# Patient Record
Sex: Female | Born: 1935 | Race: White | Hispanic: No | Smoking: Never smoker
Health system: Southern US, Community
[De-identification: ages and names within clinical notes are randomized; demographics above are authoritative.]

## PROBLEM LIST (undated history)

## (undated) DIAGNOSIS — H9190 Unspecified hearing loss, unspecified ear: Secondary | ICD-10-CM

## (undated) DIAGNOSIS — E785 Hyperlipidemia, unspecified: Secondary | ICD-10-CM

## (undated) DIAGNOSIS — I10 Essential (primary) hypertension: Secondary | ICD-10-CM

## (undated) DIAGNOSIS — J479 Bronchiectasis, uncomplicated: Secondary | ICD-10-CM

## (undated) DIAGNOSIS — I272 Pulmonary hypertension, unspecified: Secondary | ICD-10-CM

## (undated) DIAGNOSIS — A310 Pulmonary mycobacterial infection: Secondary | ICD-10-CM

## (undated) HISTORY — PX: BREAST BIOPSY: SHX20

---

## 2016-05-08 ENCOUNTER — Inpatient Hospital Stay (HOSPITAL_BASED_OUTPATIENT_CLINIC_OR_DEPARTMENT_OTHER)
Admission: EM | Admit: 2016-05-08 | Discharge: 2016-05-11 | DRG: 563 | Disposition: A | Discharge status: Skilled Nursing Facility | Payer: Medicare Other | Attending: Internal Medicine | Admitting: Internal Medicine

## 2016-05-08 ENCOUNTER — Emergency Department (HOSPITAL_BASED_OUTPATIENT_CLINIC_OR_DEPARTMENT_OTHER): Payer: Medicare Other

## 2016-05-08 ENCOUNTER — Encounter (HOSPITAL_BASED_OUTPATIENT_CLINIC_OR_DEPARTMENT_OTHER): Payer: Self-pay | Admitting: *Deleted

## 2016-05-08 DIAGNOSIS — D72829 Elevated white blood cell count, unspecified: Secondary | ICD-10-CM | POA: Diagnosis present

## 2016-05-08 DIAGNOSIS — E785 Hyperlipidemia, unspecified: Secondary | ICD-10-CM | POA: Diagnosis present

## 2016-05-08 DIAGNOSIS — E860 Dehydration: Secondary | ICD-10-CM | POA: Diagnosis present

## 2016-05-08 DIAGNOSIS — E876 Hypokalemia: Secondary | ICD-10-CM | POA: Diagnosis present

## 2016-05-08 DIAGNOSIS — S82402A Unspecified fracture of shaft of left fibula, initial encounter for closed fracture: Secondary | ICD-10-CM

## 2016-05-08 DIAGNOSIS — I7300 Raynaud's syndrome without gangrene: Secondary | ICD-10-CM | POA: Diagnosis present

## 2016-05-08 DIAGNOSIS — Z8249 Family history of ischemic heart disease and other diseases of the circulatory system: Secondary | ICD-10-CM

## 2016-05-08 DIAGNOSIS — Z882 Allergy status to sulfonamides status: Secondary | ICD-10-CM | POA: Diagnosis not present

## 2016-05-08 DIAGNOSIS — W1839XA Other fall on same level, initial encounter: Secondary | ICD-10-CM | POA: Diagnosis present

## 2016-05-08 DIAGNOSIS — R55 Syncope and collapse: Secondary | ICD-10-CM | POA: Diagnosis present

## 2016-05-08 DIAGNOSIS — Z833 Family history of diabetes mellitus: Secondary | ICD-10-CM | POA: Diagnosis not present

## 2016-05-08 DIAGNOSIS — I272 Other secondary pulmonary hypertension: Secondary | ICD-10-CM | POA: Diagnosis present

## 2016-05-08 DIAGNOSIS — S82892A Other fracture of left lower leg, initial encounter for closed fracture: Secondary | ICD-10-CM | POA: Diagnosis not present

## 2016-05-08 DIAGNOSIS — T502X5A Adverse effect of carbonic-anhydrase inhibitors, benzothiadiazides and other diuretics, initial encounter: Secondary | ICD-10-CM | POA: Diagnosis present

## 2016-05-08 DIAGNOSIS — S8262XA Displaced fracture of lateral malleolus of left fibula, initial encounter for closed fracture: Principal | ICD-10-CM | POA: Diagnosis present

## 2016-05-08 DIAGNOSIS — I10 Essential (primary) hypertension: Secondary | ICD-10-CM | POA: Diagnosis present

## 2016-05-08 DIAGNOSIS — M25572 Pain in left ankle and joints of left foot: Secondary | ICD-10-CM | POA: Diagnosis not present

## 2016-05-08 DIAGNOSIS — Z79899 Other long term (current) drug therapy: Secondary | ICD-10-CM | POA: Diagnosis not present

## 2016-05-08 DIAGNOSIS — Z66 Do not resuscitate: Secondary | ICD-10-CM | POA: Diagnosis present

## 2016-05-08 DIAGNOSIS — E871 Hypo-osmolality and hyponatremia: Secondary | ICD-10-CM | POA: Diagnosis present

## 2016-05-08 HISTORY — DX: Essential (primary) hypertension: I10

## 2016-05-08 HISTORY — DX: Pulmonary mycobacterial infection: A31.0

## 2016-05-08 HISTORY — DX: Bronchiectasis, uncomplicated: J47.9

## 2016-05-08 HISTORY — DX: Pulmonary hypertension, unspecified: I27.20

## 2016-05-08 HISTORY — DX: Hyperlipidemia, unspecified: E78.5

## 2016-05-08 LAB — CBC WITH DIFFERENTIAL/PLATELET
BAND NEUTROPHILS: 1 %
BASOS ABS: 0 10*3/uL (ref 0.0–0.1)
BASOS PCT: 0 %
EOS PCT: 0 %
Eosinophils Absolute: 0 10*3/uL (ref 0.0–0.7)
HEMATOCRIT: 41.5 % (ref 36.0–46.0)
Hemoglobin: 14.6 g/dL (ref 12.0–15.0)
LYMPHS ABS: 0.7 10*3/uL (ref 0.7–4.0)
LYMPHS PCT: 4 %
MCH: 30.6 pg (ref 26.0–34.0)
MCHC: 35.2 g/dL (ref 30.0–36.0)
MCV: 87 fL (ref 78.0–100.0)
MONO ABS: 1.4 10*3/uL — AB (ref 0.1–1.0)
Monocytes Relative: 8 %
NEUTROS PCT: 87 %
Neutro Abs: 16 10*3/uL — ABNORMAL HIGH (ref 1.7–7.7)
Platelets: 323 10*3/uL (ref 150–400)
RBC: 4.77 MIL/uL (ref 3.87–5.11)
RDW: 13.9 % (ref 11.5–15.5)
WBC: 18.1 10*3/uL — ABNORMAL HIGH (ref 4.0–10.5)

## 2016-05-08 LAB — TROPONIN I: Troponin I: 0.03 ng/mL (ref <0.03)

## 2016-05-08 LAB — BASIC METABOLIC PANEL
Anion gap: 13 (ref 5–15)
BUN: 25 mg/dL — ABNORMAL HIGH (ref 6–20)
CALCIUM: 9.1 mg/dL (ref 8.9–10.3)
CHLORIDE: 79 mmol/L — AB (ref 101–111)
CO2: 31 mmol/L (ref 22–32)
CREATININE: 0.76 mg/dL (ref 0.44–1.00)
GFR calc non Af Amer: >60 mL/min (ref >60)
GLUCOSE: 112 mg/dL — AB (ref 65–99)
Potassium: 2.9 mmol/L — ABNORMAL LOW (ref 3.5–5.1)
Sodium: 123 mmol/L — ABNORMAL LOW (ref 135–145)

## 2016-05-08 LAB — CORTISOL: Cortisol, Plasma: 11.7 ug/dL

## 2016-05-08 LAB — PHOSPHORUS: PHOSPHORUS: 2.5 mg/dL (ref 2.5–4.6)

## 2016-05-08 MED ORDER — SODIUM CHLORIDE 0.9 % IV SOLN: INTRAVENOUS | Status: AC

## 2016-05-08 MED ORDER — POTASSIUM CHLORIDE 10 MEQ/100ML IV SOLN
10.0000 meq | Freq: Once | INTRAVENOUS | Status: AC
  Administered 2016-05-08: 10 meq via INTRAVENOUS
  Filled 2016-05-08: qty 100

## 2016-05-08 MED ORDER — GUAIFENESIN ER 600 MG PO TB12
600.0000 mg | ORAL_TABLET | Freq: Two times a day (BID) | ORAL | Status: DC
  Administered 2016-05-09 – 2016-05-11 (×5): 600 mg via ORAL
  Filled 2016-05-08 (×5): qty 1

## 2016-05-08 MED ORDER — SODIUM CHLORIDE 0.9 % IV BOLUS (SEPSIS)
500.0000 mL | Freq: Once | INTRAVENOUS | Status: AC
  Administered 2016-05-08: 500 mL via INTRAVENOUS

## 2016-05-08 MED ORDER — ACETAMINOPHEN 650 MG RE SUPP: 650.0000 mg | Freq: Four times a day (QID) | RECTAL | Status: DC | PRN

## 2016-05-08 MED ORDER — ARFORMOTEROL TARTRATE 15 MCG/2ML IN NEBU
15.0000 ug | INHALATION_SOLUTION | Freq: Two times a day (BID) | RESPIRATORY_TRACT | Status: DC
  Administered 2016-05-09 – 2016-05-11 (×5): 15 ug via RESPIRATORY_TRACT
  Filled 2016-05-08 (×6): qty 2

## 2016-05-08 MED ORDER — ACETAMINOPHEN 325 MG PO TABS: 650.0000 mg | ORAL_TABLET | Freq: Four times a day (QID) | ORAL | Status: DC | PRN

## 2016-05-08 MED ORDER — DILTIAZEM HCL ER 180 MG PO CP24: 180.0000 mg | ORAL_CAPSULE | Freq: Every day | ORAL | Status: DC

## 2016-05-08 MED ORDER — POLYETHYLENE GLYCOL 3350 17 G PO PACK: 17.0000 g | PACK | Freq: Every day | ORAL | Status: DC | PRN

## 2016-05-08 MED ORDER — POTASSIUM CHLORIDE 10 MEQ/100ML IV SOLN
10.0000 meq | INTRAVENOUS | Status: AC
  Administered 2016-05-08 (×4): 10 meq via INTRAVENOUS
  Filled 2016-05-08 (×5): qty 100

## 2016-05-08 MED ORDER — HYDROCODONE-ACETAMINOPHEN 5-325 MG PO TABS: 1.0000 | ORAL_TABLET | ORAL | Status: DC | PRN

## 2016-05-08 MED ORDER — POTASSIUM CHLORIDE CRYS ER 20 MEQ PO TBCR
40.0000 meq | EXTENDED_RELEASE_TABLET | Freq: Once | ORAL | Status: AC
  Administered 2016-05-08: 40 meq via ORAL
  Filled 2016-05-08: qty 2

## 2016-05-08 MED ORDER — MAGNESIUM SULFATE 50 % IJ SOLN
1.0000 g | Freq: Once | INTRAMUSCULAR | Status: AC
  Administered 2016-05-08: 1 g via INTRAVENOUS
  Filled 2016-05-08: qty 2

## 2016-05-08 MED ORDER — ALBUTEROL SULFATE (2.5 MG/3ML) 0.083% IN NEBU: 2.5000 mg | INHALATION_SOLUTION | RESPIRATORY_TRACT | Status: DC | PRN

## 2016-05-08 MED ORDER — SODIUM CHLORIDE 0.9% FLUSH
3.0000 mL | Freq: Two times a day (BID) | INTRAVENOUS | Status: DC
  Administered 2016-05-10 – 2016-05-11 (×2): 3 mL via INTRAVENOUS

## 2016-05-08 MED ORDER — ONDANSETRON HCL 4 MG/2ML IJ SOLN: 4.0000 mg | Freq: Three times a day (TID) | INTRAMUSCULAR | Status: AC | PRN

## 2016-05-08 MED ORDER — SIMVASTATIN 10 MG PO TABS
10.0000 mg | ORAL_TABLET | Freq: Every day | ORAL | Status: DC
  Administered 2016-05-09 – 2016-05-10 (×2): 10 mg via ORAL
  Filled 2016-05-08 (×2): qty 1

## 2016-05-08 MED ORDER — ONDANSETRON HCL 4 MG/2ML IJ SOLN: 4.0000 mg | Freq: Four times a day (QID) | INTRAMUSCULAR | Status: DC | PRN

## 2016-05-08 MED ORDER — BISACODYL 10 MG RE SUPP: 10.0000 mg | Freq: Every day | RECTAL | Status: DC | PRN

## 2016-05-08 MED ORDER — DILTIAZEM HCL ER COATED BEADS 180 MG PO CP24
180.0000 mg | ORAL_CAPSULE | Freq: Every day | ORAL | Status: DC
  Administered 2016-05-09 – 2016-05-11 (×3): 180 mg via ORAL
  Filled 2016-05-08 (×3): qty 1

## 2016-05-08 MED ORDER — AZITHROMYCIN 500 MG PO TABS
250.0000 mg | ORAL_TABLET | Freq: Every day | ORAL | Status: DC
  Administered 2016-05-09 – 2016-05-11 (×3): 250 mg via ORAL
  Filled 2016-05-08 (×3): qty 1

## 2016-05-08 MED ORDER — ONDANSETRON HCL 4 MG PO TABS: 4.0000 mg | ORAL_TABLET | Freq: Four times a day (QID) | ORAL | Status: DC | PRN

## 2016-05-08 MED ORDER — SODIUM CHLORIDE 0.9 % IV SOLN
INTRAVENOUS | Status: AC
  Administered 2016-05-08 – 2016-05-09 (×2): via INTRAVENOUS

## 2016-05-08 NOTE — ED Notes (Signed)
Ambulance arrival time documented in error.

## 2016-05-08 NOTE — ED Triage Notes (Signed)
Pt reports that she fell this morning after leaning over to make her bed.  Reports being 'fuzzy headed'.  Reports left ankle pain after falling.  Pt able to bear weight.

## 2016-05-08 NOTE — ED Notes (Signed)
Attempted to call report, but per unit secretary RN was unavailable at the time. Left call back number for RN to call back when available.

## 2016-05-08 NOTE — Progress Notes (Signed)
  [facility]  Patient presented after near syncopal episode and fall resulting in nondisplaced fibular fracture. Patient found to have a sodium of 123 and potassium of 2.9. Patient started on IV normal saline and potassium repletion and easy him. Orthopedic surgery consulted, Dr Magnus [facility]Blackman of [facility], and will see the patient when they [facility]. Patient accepted to telemetry bed for observation status.   Shelly Flattenavid Merrell, MD [facility] 05/08/2016, 2:54 PM

## 2016-05-08 NOTE — ED Notes (Signed)
Paged the hospitalist at [facility]----will call back to [phone].

## 2016-05-08 NOTE — ED Provider Notes (Signed)
[facility]-EMERGENCY DEPT Provider Note   CSN: [id number] Arrival date & time: 05/08/16  1106     History   Chief Complaint Chief Complaint  Patient presents with  . Ankle Pain    HPI Heather Gaines is a 82 y.o. female.  HPI Patient reports she got dizzy this morning and fell on making her bed.  She will has left ankle pain but has been able to bear weight.  Denies chest pain.  Patient has past medical history of pulmonary hypertension.  She thinks she may have passed out.  She did not strike her head. Past Medical History:  Diagnosis Date  . Bronchiectasis (HCC)   . Hyperlipidemia   . Hypertension   . MAI (mycobacterium avium-intracellulare) (HCC)   . Pulmonary hypertension     Patient Active Problem List   Diagnosis Date Noted  . Syncope 05/08/2016  . Ankle fracture, left 05/08/2016  . Hypokalemia 05/08/2016  . Hyponatremia 05/08/2016  . Leukocytosis 05/08/2016    History reviewed. No pertinent surgical history.  OB History    No data available       Home Medications    Prior to Admission medications   Medication Sig Start Date End Date Taking? Authorizing Provider  albuterol (PROVENTIL HFA;VENTOLIN HFA) 108 (90 Base) MCG/ACT inhaler Inhale 2 puffs into the lungs every 4 (four) hours as needed for wheezing or shortness of breath.   Yes Historical Provider, MD  azithromycin (ZITHROMAX) 250 MG tablet Take 250 mg by mouth daily.    Yes Historical Provider, MD  diltiazem (DILACOR XR) 180 MG 24 hr capsule Take 180 mg by mouth daily.   Yes Historical Provider, MD  salmeterol (SEREVENT) 50 MCG/DOSE diskus inhaler Inhale 1 puff into the lungs 2 (two) times daily.   Yes Historical Provider, MD  simvastatin (ZOCOR) 10 MG tablet Take 10 mg by mouth daily.   Yes Historical Provider, MD  HYDROcodone-acetaminophen (NORCO/VICODIN) 5-325 MG tablet Take 1-2 tablets by mouth every 6 (six) hours as needed for moderate pain. 05/11/16   Jeralyn Bennett, MD  ondansetron (ZOFRAN) 4 MG tablet  Take 1 tablet (4 mg total) by mouth every 6 (six) hours as needed for nausea. 05/11/16   Jeralyn Bennett, MD  polyethylene glycol (MIRALAX / GLYCOLAX) packet Take 17 g by mouth daily as needed for mild constipation. 05/11/16   Jeralyn Bennett, MD  potassium chloride SA (K-DUR,KLOR-CON) 20 MEQ tablet Take 1 tablet (20 mEq total) by mouth once. 05/11/16 05/11/16  Jeralyn Bennett, MD    Family History Family History  Problem Relation Age of Onset  . Diabetes Mother   . Hypertension Mother   . Hypertension Sister   . CAD Sister   . Breast cancer Sister   . Melanoma Sister   . Cancer Brother   . Stroke Neg Hx     Social History Social History  Substance Use Topics  . Smoking status: Never Smoker  . Smokeless tobacco: Never Used  . Alcohol use Yes     Comment: occasional     Allergies   Sulfa antibiotics   Review of Systems Review of Systems All other systems reviewed and are negative  Physical Exam Updated Vital Signs BP (!) 148/56   Pulse 92   Temp 98.1 F (36.7 C) (Oral)   Resp 16   Ht 5\' 3"  (1.6 m)   Wt 130 lb (59 kg)   SpO2 96%   BMI 23.03 kg/m   Physical Exam Physical Exam  Nursing note and vitals  reviewed. Constitutional: She is oriented to person, place, and time. She appears well-developed and well-nourished. No distress.  HENT:  Head: Normocephalic and atraumatic.  Eyes: Pupils are equal, round, and reactive to light.  Neck: Normal range of motion.  Cardiovascular: Normal rate and intact distal pulses.   Pulmonary/Chest: No respiratory distress.  Abdominal: Normal appearance. She exhibits no distension.  Musculoskeletal: Patient has tenderness and swelling to the left lateral malleolus .  Good capillary refill and pulses in ankles. Neurological: She is alert and oriented to person, place, and time. No cranial nerve deficit.  Skin: Skin is warm and dry. No rash noted.  Psychiatric: She has a normal mood and affect. Her behavior is normal.    ED  Treatments / Results  Labs (all labs ordered are listed, but only abnormal results are displayed) Labs Reviewed  BASIC METABOLIC PANEL - Abnormal; Notable for the following:       Result Value   Sodium 123 (*)    Potassium 2.9 (*)    Chloride 79 (*)    Glucose, Bld 112 (*)    BUN 25 (*)    All other components within normal limits  CBC WITH DIFFERENTIAL/PLATELET - Abnormal; Notable for the following:    WBC 18.1 (*)    Neutro Abs 16.0 (*)    Monocytes Absolute 1.4 (*)    All other components within normal limits  OSMOLALITY, URINE - Abnormal; Notable for the following:    Osmolality, Ur 240 (*)    All other components within normal limits  TROPONIN I - Abnormal; Notable for the following:    Troponin I 0.03 (*)    All other components within normal limits  PHOSPHORUS - Abnormal; Notable for the following:    Phosphorus 1.9 (*)    All other components within normal limits  COMPREHENSIVE METABOLIC PANEL - Abnormal; Notable for the following:    Sodium 130 (*)    Potassium 2.9 (*)    Chloride 91 (*)    Glucose, Bld 102 (*)    Calcium 8.6 (*)    Total Protein 5.2 (*)    Albumin 2.9 (*)    All other components within normal limits  BASIC METABOLIC PANEL - Abnormal; Notable for the following:    Sodium 126 (*)    Potassium 3.3 (*)    Chloride 86 (*)    CO2 33 (*)    Calcium 8.5 (*)    GFR calc non Af Amer 59 (*)    All other components within normal limits  BASIC METABOLIC PANEL - Abnormal; Notable for the following:    Sodium 130 (*)    Potassium 3.3 (*)    Chloride 91 (*)    Glucose, Bld 110 (*)    Calcium 8.5 (*)    All other components within normal limits  BASIC METABOLIC PANEL - Abnormal; Notable for the following:    Sodium 129 (*)    Potassium 2.8 (*)    Chloride 93 (*)    Glucose, Bld 117 (*)    Calcium 8.5 (*)    All other components within normal limits  BASIC METABOLIC PANEL - Abnormal; Notable for the following:    Sodium 130 (*)    Potassium 3.3 (*)     Chloride 94 (*)    Glucose, Bld 117 (*)    Calcium 8.4 (*)    All other components within normal limits  MRSA PCR SCREENING  URINALYSIS, ROUTINE W REFLEX MICROSCOPIC (NOT AT [facility])  SODIUM, URINE, RANDOM  CREATININE, URINE, RANDOM  CORTISOL  PHOSPHORUS  MAGNESIUM  TSH  CBC  TROPONIN I  TROPONIN I  TROPONIN I  CBC    EKG  EKG Interpretation  Date/Time:  Monday May 08 2016 11:38:43 EDT Ventricular Rate:  80 PR Interval:    QRS Duration: 98 QT Interval:  408 QTC Calculation: 471 R Axis:   130 Text Interpretation:  Sinus arrhythmia Ventricular premature complex Right axis deviation Probable anteroseptal infarct, old No previous tracing Abnormal ekg Confirmed by Duward Allbritton  MD, Valia Wingard ([id number]) on 05/08/2016 11:51:23 AM       Radiology No results found.  Procedures Procedures (including critical care time)  Medications Ordered in ED Medications  ondansetron (ZOFRAN) injection 4 mg (not administered)  0.9 %  sodium chloride infusion ( Intravenous New Bag/Given 05/09/16 0432)  sodium chloride 0.9 % bolus 500 mL (0 mLs Intravenous Stopped 05/08/16 1607)  magnesium sulfate (IV Push/IM) injection 1 g (1 g Intravenous Given 05/08/16 1328)  potassium chloride 10 mEq in 100 mL IVPB (0 mEq Intravenous Stopped 05/08/16 1607)  0.9 %  sodium chloride infusion ( Intravenous Duplicate 05/08/16 1900)  potassium chloride 10 mEq in 100 mL IVPB (10 mEq Intravenous Given 05/08/16 2321)  potassium chloride SA (K-DUR,KLOR-CON) CR tablet 40 mEq (40 mEq Oral Given 05/08/16 2023)  potassium chloride SA (K-DUR,KLOR-CON) CR tablet 40 mEq (40 mEq Oral Given 05/09/16 0853)  potassium chloride SA (K-DUR,KLOR-CON) CR tablet 40 mEq (40 mEq Oral Given 05/10/16 1235)  potassium chloride 10 mEq in 100 mL IVPB (10 mEq Intravenous Given 05/10/16 0917)  potassium chloride SA (K-DUR,KLOR-CON) CR tablet 40 mEq (40 mEq Oral Given 05/11/16 0958)     Initial Impression / Assessment and Plan / ED Course  I have  reviewed the triage vital signs and the nursing notes.  Pertinent labs & imaging results that were available during my care of the patient were reviewed by me and considered in my medical decision making (see chart for details).  Clinical Course  Attempted to get the patient [facility] but they had no beds.  [facility].  Remained stable throughout her stay in the emergency department.  Final Clinical Impressions(s) / ED Diagnoses   Final diagnoses:  Fibula fracture, left, closed, initial encounter  Near syncope  Hyponatremia  Hypokalemia    New Prescriptions Discharge Medication List as of 05/11/2016 12:42 PM    START taking these medications   Details  ondansetron (ZOFRAN) 4 MG tablet Take 1 tablet (4 mg total) by mouth every 6 (six) hours as needed for nausea., Starting Thu 05/11/2016, Normal    polyethylene glycol (MIRALAX / GLYCOLAX) packet Take 17 g by mouth daily as needed for mild constipation., Starting Thu 05/11/2016, Normal    potassium chloride SA (K-DUR,KLOR-CON) 20 MEQ tablet Take 1 tablet (20 mEq total) by mouth once., Starting Thu 05/11/2016, Normal    HYDROcodone-acetaminophen (NORCO/VICODIN) 5-325 MG tablet Take 1-2 tablets by mouth every 6 (six) hours as needed for moderate pain., Starting Thu 05/11/2016, Print         Nelva Nayobert Aviva Wolfer, MD 05/14/16 [phone]

## 2016-05-08 NOTE — H&P (Addendum)
Heather CharonJune Sky [id number] DOB: 08/31/1924 DOA: 05/08/2016     PCP: Nadara EatonPIAZZA, MICHAEL J, MD   Outpatient Specialists: [facility] Dr. Su MonksEjaz , GI [facility] Patient [facility] from From [facility]  Chief Complaint: fall  HPI: Heather Ladona Ridgelaylor is a 82 y.o. female with medical history significant of Mycobacterium avium-intracellulare, Pulmonary HTN, AMD, HTN, HLD, Raynaud's phenomenom, and Bronchiectasis asthma as an infant   Presented with mechanical fall this morning after she leaned over to fix her bed. She felt lihghtheaded and felt her self falling unable to catch her self. Her left ankle got entrapped by the bed and twisted.  She's been feeling lightheaded prior to this. After falling she reported significant left ankle pain. Patient reports near syncopal episode prior to the fall no chest pain , o fever she has chronic cough she has been on oxygen in the past but not recently. In emergency department patient was found to have sodium of 123 potassium 2.9 she was given IV fluids and potassium was replaced. Patient reports she has been eating well and drinks about 9 glasses a day of water. Denies nausea vomiting or diarrhea  Regarding her bronchiectasis patient is on chronic azithromycin just finished a course of prednisone and Ceftindir Regarding pertinent Chronic problems: Patient has known history of bronchiectasis with recurrent exacerbations secondary to MAI followed by pulmonology at [facility]   IN ER:  Temp (24hrs), Avg:97.9 F (36.6 C), Min:97.9 F (36.6 C), Max:97.9 F (36.6 C)    Following Medications were ordered in ER: Medications  0.9 %  sodium chloride infusion (not administered)  ondansetron (ZOFRAN) injection 4 mg (not administered)  sodium chloride 0.9 % bolus 500 mL (0 mLs Intravenous Stopped 05/08/16 1607)  magnesium sulfate (IV Push/IM) injection 1 g (1 g Intravenous Given 05/08/16 1328)  potassium chloride 10 mEq in 100 mL  IVPB (0 mEq Intravenous Stopped 05/08/16 1607)     ER provider discussed case with:  Dr. Tracey HarriesBlackman [facility]  Hospitalist was called for admission for hypokalemia  Review of Systems:    Pertinent positives include:   dizziness, shortness of breath at rest.   dyspnea on exertion,   excess mucus, no productive cough,   Constitutional:  No weight loss, night sweats, Fevers, chills, fatigue, weight loss  HEENT:  No headaches, Difficulty swallowing,Tooth/dental problems,Sore throat,  No sneezing, itching, ear ache, nasal congestion, post nasal drip,  Cardio-vascular:  No chest pain, Orthopnea, PND, anasarca palpitations.no Bilateral lower extremity swelling  GI:  No heartburn, indigestion, abdominal pain, nausea, vomiting, diarrhea, change in bowel habits, loss of appetite, melena, blood in stool, hematemesis Resp:  noNo non-productive cough, No coughing up of blood.No change in color of mucus.No wheezing. Skin:  no rash or lesions. No jaundice GU:  no dysuria, change in color of urine, no urgency or frequency. No straining to urinate.  No flank pain.  Musculoskeletal:  No joint pain or no joint swelling. No decreased range of motion. No back pain.  Psych:  No change in mood or affect. No depression or anxiety. No memory loss.  Neuro: no localizing neurological complaints, no tingling, no weakness, no double vision, no gait abnormality, no slurred speech, no confusion  As per HPI otherwise 10 point review of systems negative.   Past Medical History: Past Medical History:  Diagnosis Date  . Pulmonary hypertension (HCC)    History reviewed. No pertinent surgical history.   Social History:  Ambulatory   independently  reports that she has never smoked. She has never used smokeless tobacco. She reports that she does not drink alcohol or use drugs.  Allergies:   Allergies  Allergen Reactions  . Sulfa Antibiotics        Family History:   Family History    Problem Relation Age of Onset  . Diabetes Mother   . Hypertension Mother   . Hypertension Sister   . CAD Sister   . Breast cancer Sister   . Melanoma Sister   . Cancer Brother   . Stroke Neg Hx      medications: Current Meds  Medication Sig  . albuterol (PROVENTIL HFA;VENTOLIN HFA) 108 (90 Base) MCG/ACT inhaler Inhale 2 puffs into the lungs every 4 (four) hours as needed for wheezing or shortness of breath.  Marland Kitchen azithromycin (ZITHROMAX) 250 MG tablet Take by mouth daily.  . chlorthalidone (HYGROTON) 25 MG tablet Take 25 mg by mouth daily.  Marland Kitchen diltiazem (DILACOR XR) 180 MG 24 hr capsule Take 180 mg by mouth daily.  . salmeterol (SEREVENT) 50 MCG/DOSE diskus inhaler Inhale 1 puff into the lungs 2 (two) times daily.  . simvastatin (ZOCOR) 10 MG tablet Take 10 mg by mouth daily.  . [DISCONTINUED] diltiazem (CARDIZEM) 30 MG tablet Take 30 mg by mouth 4 (four) times daily.  . [DISCONTINUED] simvastatin (ZOCOR) 40 MG tablet Take 10 mg by mouth daily.       Physical Exam: Patient Vitals for the past 24 hrs:  BP Temp Temp src Pulse Resp SpO2 Height Weight  05/08/16 1630 106/64 - - 83 20 95 % - -  05/08/16 1620 (!) 131/112 - - (!) 138 19 95 % - -  05/08/16 1600 144/60 - - - 17 96 % - -  05/08/16 1530 145/69 - - - 20 98 % - -  05/08/16 1500 154/58 - - 78 10 95 % - -  05/08/16 1430 144/61 - - 75 13 93 % - -  05/08/16 1400 145/64 - - 77 12 96 % - -  05/08/16 1330 138/58 - - 83 18 96 % - -  05/08/16 1300 153/62 - - 78 16 99 % - -  05/08/16 1230 151/58 - - 73 20 94 % - -  05/08/16 1200 152/58 - - 69 15 96 % - -  05/08/16 1116 - - - - - - 5\' 3"  (1.6 m) 59 kg (130 lb)  05/08/16 1115 157/60 97.9 F (36.6 C) Oral 86 18 100 % - -    1. General:  in No Acute distress 2. Psychological: Alert and  Oriented 3. Head/ENT:   Dry Mucous Membranes                          Head Non traumatic, neck supple                         Poor Dentition 4. SKIN:  decreased Skin turgor,  Skin clean Dry and  intact no rash 5. Heart: Regular rate and rhythm no Murmur, Rub or gallop 6. Lungs: no wheezes diffuse crackles   7. Abdomen: Soft,  non-tender, Non distended 8. Lower extremities: no clubbing, cyanosis, or edema 9. Neurologically Grossly intact, moving all 4 extremities equally  10. MSK: Normal range of motion left ankle in a cast   body mass index is 23.03 kg/m.  Labs on Admission:   Labs on Admission: I have personally reviewed  following labs and imaging studies  CBC:  Recent Labs Lab 05/08/16 1145  WBC 18.1*  NEUTROABS 16.0*  HGB 14.6  HCT 41.5  MCV 87.0  PLT 323   Basic Metabolic Panel:  Recent Labs Lab 05/08/16 1145  NA 123*  K 2.9*  CL 79*  CO2 31  GLUCOSE 112*  BUN 25*  CREATININE 0.76  CALCIUM 9.1   GFR: Estimated Creatinine Clearance: 39.4 mL/min (by C-G formula based on SCr of 0.76 mg/dL). Liver Function Tests: No results for input(s): AST, ALT, ALKPHOS, BILITOT, PROT, ALBUMIN in the last 168 hours. No results for input(s): LIPASE, AMYLASE in the last 168 hours. No results for input(s): AMMONIA in the last 168 hours. Coagulation Profile: No results for input(s): INR, PROTIME in the last 168 hours. Cardiac Enzymes: No results for input(s): CKTOTAL, CKMB, CKMBINDEX, TROPONINI in the last 168 hours. BNP (last 3 results) No results for input(s): PROBNP in the last 8760 hours. HbA1C: No results for input(s): HGBA1C in the last 72 hours. CBG: No results for input(s): GLUCAP in the last 168 hours. Lipid Profile: No results for input(s): CHOL, HDL, LDLCALC, TRIG, CHOLHDL, LDLDIRECT in the last 72 hours. Thyroid Function Tests: No results for input(s): TSH, T4TOTAL, FREET4, T3FREE, THYROIDAB in the last 72 hours. Anemia Panel: No results for input(s): VITAMINB12, FOLATE, FERRITIN, TIBC, IRON, RETICCTPCT in the last 72 hours. Urine analysis: No results found for: COLORURINE, APPEARANCEUR, LABSPEC, PHURINE, GLUCOSEU, HGBUR, BILIRUBINUR, KETONESUR,  PROTEINUR, UROBILINOGEN, NITRITE, LEUKOCYTESUR Sepsis Labs: @LABRCNTIP (procalcitonin:4,lacticidven:4) )No results found for this or any previous visit (from the past 240 hour(s)).    UA  ordered  No results found for: HGBA1C  Estimated Creatinine Clearance: 39.4 mL/min (by C-G formula based on SCr of 0.76 mg/dL).  BNP (last 3 results) No results for input(s): PROBNP in the last 8760 hours.   ECG REPORT  Independently reviewed Rate: 80  Rhythm: sinus rhythm with PVC ST&T Change: No acute ischemic changes   QTC 471  Filed Weights   05/08/16 1116  Weight: 59 kg (130 lb)     Cultures: No results found for: SDES, SPECREQUEST, CULT, REPTSTATUS   Radiological Exams on Admission: Dg Ankle Complete Left  Result Date: 05/08/2016 CLINICAL DATA:  Fall, ankle injury. EXAM: LEFT ANKLE COMPLETE - 3+ VIEW COMPARISON:  None. FINDINGS: There is a slightly displaced obliquely-oriented fracture of the distal left fibula. Tiny avulsion fracture fragment is seen along the inferior margin of the medial malleolus, of uncertain age. Distal tibia otherwise intact and normally aligned. Ankle mortise is symmetric. Visualized portions of the hindfoot and midfoot appear intact and normally aligned. Prominent soft tissue swelling noted over the lateral malleolus. IMPRESSION: 1. Slightly displaced, obliquely oriented, fracture of the distal left fibula. 2. Tiny avulsion fracture fragment immediately subjacent to the medial malleolus, of uncertain age, but favored to be chronic. 3. Soft tissue swelling. Electronically Signed   By: Bary Richard M.D.   On: 05/08/2016 11:41    Chart has been reviewed    Assessment/Plan  82 y.o. female with medical history significant of Mycobacterium avium-intracellulare, Pulmonary HTN, AMD, HTN, HLD, Raynaud's phenomenom, and Bronchiectasis asthma as an infant admitted for presyncope, hyponatremia, hypokalemia and left ankle fracture  Present on Admission: . pre-Syncope -  will give gentle rehydration cycle cardiac enzymes obtain echogram orthostatics if able to tolerate . Ankle fracture,Orthopedics is aware awaiting presyncope workup and electrolyte stability is age and prior to proceeding we'll defer to orthopedics if operative treatment is required .  Hypokalemia will replace and check when he's in level . Hyponatremia will discontinue chlorthalidone will check a urine sodium and electrolytes urine osmolarity obtain UA. Follow electrolytes closely obtain serial sodium . Leukocytosis patient has just finished a course of steroids for bronchiectasis Bronchiectasis this is chronic continue home medications  Other plan as per orders.  DVT prophylaxis:  SCD     Code Status:    DNR/DNI  as per patient   Family Communication:   Family not at  Bedside    Disposition Plan:    likely will need placement for rehabilitation                                     Would benefit from PT/OT eval prior to DC                       Social Work    consulted                          Consults called:[facility]  Admission status:  inpatient     Level of care    tele         I have spent a total of 56 min on this admission   Edda Orea 05/08/2016, 11:12 PM    [facility]  Pager [phone]   after 2 AM please page floor coverage PA If 7AM-7PM, please contact the day team taking care of the patient  [email or web address]  Password [email or web address]

## 2016-05-08 NOTE — ED Notes (Signed)
Called the ED doctor at [facility] for Dr. Jen MowBeaton----transferred to [phone]

## 2016-05-08 NOTE — Progress Notes (Signed)
CRITICAL VALUE ALERT  Critical value received:  Troponin .03  Date of notification:  05/08/16  Time of notification:  2041  Critical value read back:Yes.    Nurse who received alert:  S.Nolen MuMckinney  MD notified (1st page):  K.Kirby  Time of first page:  2049  MD notified (2nd page):   Time of second page:  Responding MD:    Time MD responded:  2051

## 2016-05-09 LAB — MAGNESIUM: Magnesium: 1.7 mg/dL (ref 1.7–2.4)

## 2016-05-09 LAB — URINALYSIS, ROUTINE W REFLEX MICROSCOPIC
BILIRUBIN URINE: NEGATIVE
Glucose, UA: NEGATIVE mg/dL
HGB URINE DIPSTICK: NEGATIVE
Ketones, ur: NEGATIVE mg/dL
Leukocytes, UA: NEGATIVE
NITRITE: NEGATIVE
PROTEIN: NEGATIVE mg/dL
Specific Gravity, Urine: 1.006 (ref 1.005–1.030)
pH: 7.5 (ref 5.0–8.0)

## 2016-05-09 LAB — BASIC METABOLIC PANEL
Anion gap: 7 (ref 5–15)
Anion gap: 8 (ref 5–15)
BUN: 13 mg/dL (ref 6–20)
BUN: 16 mg/dL (ref 6–20)
CHLORIDE: 86 mmol/L — AB (ref 101–111)
CHLORIDE: 91 mmol/L — AB (ref 101–111)
CO2: 31 mmol/L (ref 22–32)
CO2: 33 mmol/L — AB (ref 22–32)
CREATININE: 0.69 mg/dL (ref 0.44–1.00)
CREATININE: 0.85 mg/dL (ref 0.44–1.00)
Calcium: 8.5 mg/dL — ABNORMAL LOW (ref 8.9–10.3)
Calcium: 8.5 mg/dL — ABNORMAL LOW (ref 8.9–10.3)
GFR calc Af Amer: >60 mL/min (ref >60)
GFR calc Af Amer: >60 mL/min (ref >60)
GFR calc non Af Amer: 59 mL/min — ABNORMAL LOW (ref >60)
GFR calc non Af Amer: >60 mL/min (ref >60)
GLUCOSE: 110 mg/dL — AB (ref 65–99)
Glucose, Bld: 98 mg/dL (ref 65–99)
Potassium: 3.3 mmol/L — ABNORMAL LOW (ref 3.5–5.1)
Potassium: 3.3 mmol/L — ABNORMAL LOW (ref 3.5–5.1)
SODIUM: 126 mmol/L — AB (ref 135–145)
Sodium: 130 mmol/L — ABNORMAL LOW (ref 135–145)

## 2016-05-09 LAB — TSH: TSH: 1 u[IU]/mL (ref 0.350–4.500)

## 2016-05-09 LAB — COMPREHENSIVE METABOLIC PANEL
ALBUMIN: 2.9 g/dL — AB (ref 3.5–5.0)
ALK PHOS: 55 U/L (ref 38–126)
ALT: 21 U/L (ref 14–54)
AST: 21 U/L (ref 15–41)
Anion gap: 9 (ref 5–15)
BUN: 13 mg/dL (ref 6–20)
CALCIUM: 8.6 mg/dL — AB (ref 8.9–10.3)
CO2: 30 mmol/L (ref 22–32)
CREATININE: 0.7 mg/dL (ref 0.44–1.00)
Chloride: 91 mmol/L — ABNORMAL LOW (ref 101–111)
GFR calc non Af Amer: >60 mL/min (ref >60)
Glucose, Bld: 102 mg/dL — ABNORMAL HIGH (ref 65–99)
Potassium: 2.9 mmol/L — ABNORMAL LOW (ref 3.5–5.1)
Sodium: 130 mmol/L — ABNORMAL LOW (ref 135–145)
Total Bilirubin: 1.1 mg/dL (ref 0.3–1.2)
Total Protein: 5.2 g/dL — ABNORMAL LOW (ref 6.5–8.1)

## 2016-05-09 LAB — PHOSPHORUS: Phosphorus: 1.9 mg/dL — ABNORMAL LOW (ref 2.5–4.6)

## 2016-05-09 LAB — CREATININE, URINE, RANDOM: Creatinine, Urine: 21.98 mg/dL

## 2016-05-09 LAB — CBC
HCT: 39.7 % (ref 36.0–46.0)
Hemoglobin: 13.4 g/dL (ref 12.0–15.0)
MCH: 29.9 pg (ref 26.0–34.0)
MCHC: 33.8 g/dL (ref 30.0–36.0)
MCV: 88.6 fL (ref 78.0–100.0)
PLATELETS: 299 10*3/uL (ref 150–400)
RBC: 4.48 MIL/uL (ref 3.87–5.11)
RDW: 13.8 % (ref 11.5–15.5)
WBC: 10.4 10*3/uL (ref 4.0–10.5)

## 2016-05-09 LAB — TROPONIN I
Troponin I: <0.03 ng/mL (ref <0.03)
Troponin I: <0.03 ng/mL (ref <0.03)

## 2016-05-09 LAB — MRSA PCR SCREENING: MRSA by PCR: NEGATIVE

## 2016-05-09 LAB — OSMOLALITY, URINE: Osmolality, Ur: 240 mOsm/kg — ABNORMAL LOW (ref 300–900)

## 2016-05-09 LAB — SODIUM, URINE, RANDOM: SODIUM UR: 63 mmol/L

## 2016-05-09 MED ORDER — ENOXAPARIN SODIUM 40 MG/0.4ML ~~LOC~~ SOLN
40.0000 mg | SUBCUTANEOUS | Status: DC
  Administered 2016-05-09 – 2016-05-10 (×2): 40 mg via SUBCUTANEOUS
  Filled 2016-05-09 (×2): qty 0.4

## 2016-05-09 MED ORDER — SODIUM CHLORIDE 0.9 % IV SOLN
INTRAVENOUS | Status: DC
  Administered 2016-05-09: 1000 mL via INTRAVENOUS
  Administered 2016-05-10: 15:00:00 via INTRAVENOUS

## 2016-05-09 MED ORDER — POTASSIUM CHLORIDE CRYS ER 20 MEQ PO TBCR
40.0000 meq | EXTENDED_RELEASE_TABLET | Freq: Once | ORAL | Status: AC
  Administered 2016-05-09: 40 meq via ORAL
  Filled 2016-05-09: qty 2

## 2016-05-09 NOTE — Consult Note (Signed)
Reason for Consult:  Left ankle fracture Referring Physician: Audie Pinto, ED MD  Heather Gaines is an 82 y.o. female.  HPI:   82 yo patient who sustained a mechanical fall yesterday when she felt dizzy while up making her bed.  With left ankle pain, was taken to [place] and found to have a left ankle fracture.  She was placed appropriately in a splint, but admitted to [facility] due to low sodium and potassium.  She does reports left ankle pain.  She is a resident of [facility] in [place].  Past Medical History:  Diagnosis Date  . Bronchiectasis ([place])   . Hyperlipidemia   . Hypertension   . MAI (mycobacterium avium-intracellulare) ([place])   . Pulmonary hypertension ([place])     History reviewed. No pertinent surgical history.  Family History  Problem Relation Age of Onset  . Diabetes Mother   . Hypertension Mother   . Hypertension Sister   . CAD Sister   . Breast cancer Sister   . Melanoma Sister   . Cancer Brother   . Stroke Neg Hx     Social History:  reports that she has never smoked. She has never used smokeless tobacco. She reports that she drinks alcohol. She reports that she does not use drugs.  Allergies:  Allergies  Allergen Reactions  . Sulfa Antibiotics     Medications: I have reviewed the patient's current medications.  Results for orders placed or performed during the hospital encounter of 05/08/16 (from the past 48 hour(s))  Basic metabolic panel     Status: Abnormal   Collection Time: 05/08/16 11:45 AM  Result Value Ref Range   Sodium 123 (L) 135 - 145 mmol/L   Potassium 2.9 (L) 3.5 - 5.1 mmol/L   Chloride 79 (L) 101 - 111 mmol/L   CO2 31 22 - 32 mmol/L   Glucose, Bld 112 (H) 65 - 99 mg/dL   BUN 25 (H) 6 - 20 mg/dL   Creatinine, Ser 0.76 0.44 - 1.00 mg/dL   Calcium 9.1 8.9 - 10.3 mg/dL   GFR calc non Af Amer >60 >60 mL/min   GFR calc Af Amer >60 >60 mL/min    Comment: (NOTE) The eGFR has been calculated using the CKD EPI equation. This calculation has not  been validated in all clinical situations. eGFR's persistently <60 mL/min signify possible Chronic Kidney Disease.    Anion gap 13 5 - 15  CBC with Differential/Platelet     Status: Abnormal   Collection Time: 05/08/16 11:45 AM  Result Value Ref Range   WBC 18.1 (H) 4.0 - 10.5 K/uL   RBC 4.77 3.87 - 5.11 MIL/uL   Hemoglobin 14.6 12.0 - 15.0 g/dL   HCT 41.5 36.0 - 46.0 %   MCV 87.0 78.0 - 100.0 fL   MCH 30.6 26.0 - 34.0 pg   MCHC 35.2 30.0 - 36.0 g/dL   RDW 13.9 11.5 - 15.5 %   Platelets 323 150 - 400 K/uL   Neutrophils Relative % 87 %   Lymphocytes Relative 4 %   Monocytes Relative 8 %   Eosinophils Relative 0 %   Basophils Relative 0 %   Band Neutrophils 1 %   Neutro Abs 16.0 (H) 1.7 - 7.7 K/uL   Lymphs Abs 0.7 0.7 - 4.0 K/uL   Monocytes Absolute 1.4 (H) 0.1 - 1.0 K/uL   Eosinophils Absolute 0.0 0.0 - 0.7 K/uL   Basophils Absolute 0.0 0.0 - 0.1 K/uL   Smear  Review LARGE PLATELETS PRESENT   Troponin I (q 6hr x 3)     Status: Abnormal   Collection Time: 05/08/16  7:34 PM  Result Value Ref Range   Troponin I 0.03 (HH) <0.03 ng/mL    Comment: CRITICAL RESULT CALLED TO, READ BACK BY AND VERIFIED WITH: C MCKINNEY,RN 2042 05/08/2016 WBOND   Cortisol     Status: None   Collection Time: 05/08/16  7:34 PM  Result Value Ref Range   Cortisol, Plasma 11.7 ug/dL    Comment: (NOTE) AM    6.7 - 22.6 ug/dL PM   <10.0       ug/dL   Phosphorus     Status: None   Collection Time: 05/08/16  7:34 PM  Result Value Ref Range   Phosphorus 2.5 2.5 - 4.6 mg/dL  Troponin I     Status: None   Collection Time: 05/08/16 11:35 PM  Result Value Ref Range   Troponin I <0.03 <0.03 ng/mL  Basic metabolic panel     Status: Abnormal   Collection Time: 05/08/16 11:35 PM  Result Value Ref Range   Sodium 126 (L) 135 - 145 mmol/L   Potassium 3.3 (L) 3.5 - 5.1 mmol/L   Chloride 86 (L) 101 - 111 mmol/L   CO2 33 (H) 22 - 32 mmol/L   Glucose, Bld 98 65 - 99 mg/dL   BUN 16 6 - 20 mg/dL   Creatinine,  Ser 0.85 0.44 - 1.00 mg/dL   Calcium 8.5 (L) 8.9 - 10.3 mg/dL   GFR calc non Af Amer 59 (L) >60 mL/min   GFR calc Af Amer >60 >60 mL/min    Comment: (NOTE) The eGFR has been calculated using the CKD EPI equation. This calculation has not been validated in all clinical situations. eGFR's persistently <60 mL/min signify possible Chronic Kidney Disease.    Anion gap 7 5 - 15  Urinalysis, Routine w reflex microscopic (not at [facility])     Status: None   Collection Time: 05/09/16 12:30 AM  Result Value Ref Range   Color, Urine YELLOW YELLOW   APPearance CLEAR CLEAR   Specific Gravity, Urine 1.006 1.005 - 1.030   pH 7.5 5.0 - 8.0   Glucose, UA NEGATIVE NEGATIVE mg/dL   Hgb urine dipstick NEGATIVE NEGATIVE   Bilirubin Urine NEGATIVE NEGATIVE   Ketones, ur NEGATIVE NEGATIVE mg/dL   Protein, ur NEGATIVE NEGATIVE mg/dL   Nitrite NEGATIVE NEGATIVE   Leukocytes, UA NEGATIVE NEGATIVE    Comment: MICROSCOPIC NOT DONE ON URINES WITH NEGATIVE PROTEIN, BLOOD, LEUKOCYTES, NITRITE, OR GLUCOSE <1000 mg/dL.  Sodium, urine, random     Status: None   Collection Time: 05/09/16 12:30 AM  Result Value Ref Range   Sodium, Ur 63 mmol/L  Creatinine, urine, random     Status: None   Collection Time: 05/09/16 12:30 AM  Result Value Ref Range   Creatinine, Urine 21.98 mg/dL  Osmolality, urine     Status: Abnormal   Collection Time: 05/09/16 12:31 AM  Result Value Ref Range   Osmolality, Ur 240 (L) 300 - 900 mOsm/kg  Magnesium     Status: None   Collection Time: 05/09/16  5:21 AM  Result Value Ref Range   Magnesium 1.7 1.7 - 2.4 mg/dL  Phosphorus     Status: Abnormal   Collection Time: 05/09/16  5:21 AM  Result Value Ref Range   Phosphorus 1.9 (L) 2.5 - 4.6 mg/dL  TSH     Status: None   Collection  Time: 05/09/16  5:21 AM  Result Value Ref Range   TSH 1.000 0.350 - 4.500 uIU/mL  Comprehensive metabolic panel     Status: Abnormal   Collection Time: 05/09/16  5:21 AM  Result Value Ref Range   Sodium 130  (L) 135 - 145 mmol/L   Potassium 2.9 (L) 3.5 - 5.1 mmol/L   Chloride 91 (L) 101 - 111 mmol/L   CO2 30 22 - 32 mmol/L   Glucose, Bld 102 (H) 65 - 99 mg/dL   BUN 13 6 - 20 mg/dL   Creatinine, Ser 0.70 0.44 - 1.00 mg/dL   Calcium 8.6 (L) 8.9 - 10.3 mg/dL   Total Protein 5.2 (L) 6.5 - 8.1 g/dL   Albumin 2.9 (L) 3.5 - 5.0 g/dL   AST 21 15 - 41 U/L   ALT 21 14 - 54 U/L   Alkaline Phosphatase 55 38 - 126 U/L   Total Bilirubin 1.1 0.3 - 1.2 mg/dL   GFR calc non Af Amer >60 >60 mL/min   GFR calc Af Amer >60 >60 mL/min    Comment: (NOTE) The eGFR has been calculated using the CKD EPI equation. This calculation has not been validated in all clinical situations. eGFR's persistently <60 mL/min signify possible Chronic Kidney Disease.    Anion gap 9 5 - 15  CBC     Status: None   Collection Time: 05/09/16  5:21 AM  Result Value Ref Range   WBC 10.4 4.0 - 10.5 K/uL   RBC 4.48 3.87 - 5.11 MIL/uL   Hemoglobin 13.4 12.0 - 15.0 g/dL   HCT 39.7 36.0 - 46.0 %   MCV 88.6 78.0 - 100.0 fL   MCH 29.9 26.0 - 34.0 pg   MCHC 33.8 30.0 - 36.0 g/dL   RDW 13.8 11.5 - 15.5 %   Platelets 299 150 - 400 K/uL  Troponin I     Status: None   Collection Time: 05/09/16  5:21 AM  Result Value Ref Range   Troponin I <0.03 <0.03 ng/mL    Dg Ankle Complete Left  Result Date: 05/08/2016 CLINICAL DATA:  Fall, ankle injury. EXAM: LEFT ANKLE COMPLETE - 3+ VIEW COMPARISON:  None. FINDINGS: There is a slightly displaced obliquely-oriented fracture of the distal left fibula. Tiny avulsion fracture fragment is seen along the inferior margin of the medial malleolus, of uncertain age. Distal tibia otherwise intact and normally aligned. Ankle mortise is symmetric. Visualized portions of the hindfoot and midfoot appear intact and normally aligned. Prominent soft tissue swelling noted over the lateral malleolus. IMPRESSION: 1. Slightly displaced, obliquely oriented, fracture of the distal left fibula. 2. Tiny avulsion fracture  fragment immediately subjacent to the medial malleolus, of uncertain age, but favored to be chronic. 3. Soft tissue swelling. Electronically Signed   By: Franki Cabot M.D.   On: 05/08/2016 11:41    ROS Blood pressure (!) 144/62, pulse 81, temperature 98.2 F (36.8 C), temperature source Oral, resp. rate 17, height '5\' 3"'  (1.6 m), weight 59 kg (130 lb), SpO2 95 %. Physical Exam  Constitutional: She is oriented to person, place, and time. She appears well-developed and well-nourished.  HENT:  Head: Normocephalic and atraumatic.  Eyes: EOM are normal. Pupils are equal, round, and reactive to light.  Neck: Normal range of motion. Neck supple.  Cardiovascular: Normal rate.   Respiratory: Effort normal and breath sounds normal.  GI: Soft. Bowel sounds are normal.  Musculoskeletal:       Left ankle: She exhibits  swelling and ecchymosis. Tenderness. Lateral malleolus tenderness found.  Neurological: She is alert and oriented to person, place, and time.  Skin: Skin is warm and dry.  Psychiatric: She has a normal mood and affect.    Assessment/Plan: Left ankle lateral malleolus fracture 1)  This fracture can likely be treated non-operatively in a splint.  She will need to remain non-weight bearing on her left ankle for likely the next 4-6 weeks.  She needs to remain in the splint for now.  Will order PT/OT.  Will need follow-up with me in one week as an outpatient.  Mcarthur Rossetti 05/09/2016, 7:28 AM

## 2016-05-09 NOTE — Care Management Note (Addendum)
Case Management Note  Patient Details  Name: Teka Deyo MRN: [id number] Date of Birth: 12/04/1924  Subjective/Objective:                 Spoke with patient and son in the room. Patient is from [facility]. Patient states that she has private duty caregiver who assists her a few hours 1-2 days a week. She states she has a cane she rarely uses and a rolator. Patient sustained a fracture to her ankle and will be non weight bearing for several weeks. Anticipate HH and DME needs. Patient and son would like to return to [facility] if possible. NO surgery planned at this time, replacing electrolytes, K+ low on admission.    Action/Plan:  CM will continue to [facility] and DME needs. [facility] will need to [facility] faxed to [facility] at [phone] Attn: Debra at discharge. They will set up PT for patient.  Addendum 05-11-16 Patient will [facility] prior to returning to [facility].   Expected Discharge Date:                  Expected Discharge Plan:  Home w Home Health Services  In-House Referral:     Discharge planning Services  CM Consult  Post Acute Care Choice:    Choice offered to:     DME Arranged:    DME Agency:     HH Arranged:    HH Agency:     Status of Service:  In process, will continue to follow  If discussed at Long Length of Stay Meetings, dates discussed:    Additional Comments:  Lawerance SabalDebbie Lagina Reader, RN 05/09/2016, 12:03 PM

## 2016-05-09 NOTE — Progress Notes (Signed)
PROGRESS NOTE    Heather Gaines  WUJ:[id number] DOB: Oct 28, 1924 DOA: 05/08/2016 PCP: Nadara Eaton, MD  Brief Narrative:  82 yo patient with PMH of Chronic bronchiectasis/MAI, Pulm HTN, HTN, who sustained a mechanical fall yesterday when she felt dizzy while up making her bed.  With left ankle pain, was taken to [facility] and found to have a left ankle fracture and hyponatremia/hypokalemia.  She was placed in a splint, but admitted to [facility]  Ortho consulted, splint and non weight bearing recommended  Assessment & Plan: 1. Dizziness -no syncope, could be due to dehydration from chlorthalidone, was hyponatremic on admission, now improving with IVF -stopped diuretic -Pt eval, may need SNF due to ankle fracture -ECHO ordered by admitting MD, will FU  2. Ankle fracture -Orthopedic consulted, Dr.Blackman recommended splint and Non weight bearing for 4-6weeks -Pt consulted  3. Hyponatremia -due to dehydration and chlorthalidone -stopped diuretic, may need alternate BP med, if BP trends up off chlorthalidone -cut down IVF  4. Hypokalemia  -due to diuretic, improving, replaced  5. Chronic bronchiectasis -with MAI, on chronic suppressive azithromycin, followed by Pulm at [facility] -this is stable, no active symptoms at this time  DVT prophylaxis:  lovenox  Code Status:    DNR/DNI  as per patient   Family Communication:   Family not at  Bedside Disposition Plan: may need SNF, await Pt/Ot input   Consultants:  Ortho  Subjective: Feels ok, some L ankle discomfort, but otherwise feels well, her Resp status is stable  Objective: Vitals:   05/08/16 1910 05/08/16 2120 05/09/16 0426 05/09/16 0734  BP: (!) 158/54 (!) 140/46 (!) 144/62   Pulse: 79 84 81   Resp: 17 16 17    Temp: 98.2 F (36.8 C) 97.5 F (36.4 C) 98.2 F (36.8 C)   TempSrc: Oral Oral Oral   SpO2: 95% 100% 95% 98%  Weight:      Height:        Intake/Output Summary (Last 24 hours) at 05/09/16 1133 Last data  filed at 05/09/16 1045  Gross per 24 hour  Intake             2440 ml  Output             3050 ml  Net             -610 ml   Filed Weights   05/08/16 1116  Weight: 59 kg (130 lb)    Examination:  General exam: Appears calm and comfortable, AAOx3, no distress Respiratory system: Clear to auscultation. Respiratory effort normal. Cardiovascular system: S1 & S2 heard, RRR. No JVD, murmurs, rubs, gallops or clicks. No pedal edema. Gastrointestinal system: Abdomen is nondistended, soft and nontender.  Central nervous system: Alert and oriented. No focal neurological deficits. Extremities: L ankle in cast Skin: No rashes, lesions or ulcers Psychiatry: Judgement and insight appear normal. Mood & affect appropriate.     Data Reviewed: I have personally reviewed following labs and imaging studies  CBC:  Recent Labs Lab 05/08/16 1145 05/09/16 0521  WBC 18.1* 10.4  NEUTROABS 16.0*  --   HGB 14.6 13.4  HCT 41.5 39.7  MCV 87.0 88.6  PLT 323 299   Basic Metabolic Panel:  Recent Labs Lab 05/08/16 1145 05/08/16 1934 05/08/16 2335 05/09/16 0521 05/09/16 0610  NA 123*  --  126* 130* 130*  K 2.9*  --  3.3* 2.9* 3.3*  CL 79*  --  86* 91* 91*  CO2 31  --  33* 30 31  GLUCOSE 112*  --  98 102* 110*  BUN 25*  --  16 13 13   CREATININE 0.76  --  0.85 0.70 0.69  CALCIUM 9.1  --  8.5* 8.6* 8.5*  MG  --   --   --  1.7  --   PHOS  --  2.5  --  1.9*  --    GFR: Estimated Creatinine Clearance: 39.4 mL/min (by C-G formula based on SCr of 0.69 mg/dL). Liver Function Tests:  Recent Labs Lab 05/09/16 0521  AST 21  ALT 21  ALKPHOS 55  BILITOT 1.1  PROT 5.2*  ALBUMIN 2.9*   No results for input(s): LIPASE, AMYLASE in the last 168 hours. No results for input(s): AMMONIA in the last 168 hours. Coagulation Profile: No results for input(s): INR, PROTIME in the last 168 hours. Cardiac Enzymes:  Recent Labs Lab 05/08/16 1934 05/08/16 2335 05/09/16 0521  TROPONINI 0.03* <0.03  <0.03   BNP (last 3 results) No results for input(s): PROBNP in the last 8760 hours. HbA1C: No results for input(s): HGBA1C in the last 72 hours. CBG: No results for input(s): GLUCAP in the last 168 hours. Lipid Profile: No results for input(s): CHOL, HDL, LDLCALC, TRIG, CHOLHDL, LDLDIRECT in the last 72 hours. Thyroid Function Tests:  Recent Labs  05/09/16 0521  TSH 1.000   Anemia Panel: No results for input(s): VITAMINB12, FOLATE, FERRITIN, TIBC, IRON, RETICCTPCT in the last 72 hours. Urine analysis:    Component Value Date/Time   COLORURINE YELLOW 05/09/2016 0030   APPEARANCEUR CLEAR 05/09/2016 0030   LABSPEC 1.006 05/09/2016 0030   PHURINE 7.5 05/09/2016 0030   GLUCOSEU NEGATIVE 05/09/2016 0030   HGBUR NEGATIVE 05/09/2016 0030   BILIRUBINUR NEGATIVE 05/09/2016 0030   KETONESUR NEGATIVE 05/09/2016 0030   PROTEINUR NEGATIVE 05/09/2016 0030   NITRITE NEGATIVE 05/09/2016 0030   LEUKOCYTESUR NEGATIVE 05/09/2016 0030   Sepsis Labs: @LABRCNTIP (procalcitonin:4,lacticidven:4)  )No results found for this or any previous visit (from the past 240 hour(s)).       Radiology Studies: Dg Ankle Complete Left  Result Date: 05/08/2016 CLINICAL DATA:  Fall, ankle injury. EXAM: LEFT ANKLE COMPLETE - 3+ VIEW COMPARISON:  None. FINDINGS: There is a slightly displaced obliquely-oriented fracture of the distal left fibula. Tiny avulsion fracture fragment is seen along the inferior margin of the medial malleolus, of uncertain age. Distal tibia otherwise intact and normally aligned. Ankle mortise is symmetric. Visualized portions of the hindfoot and midfoot appear intact and normally aligned. Prominent soft tissue swelling noted over the lateral malleolus. IMPRESSION: 1. Slightly displaced, obliquely oriented, fracture of the distal left fibula. 2. Tiny avulsion fracture fragment immediately subjacent to the medial malleolus, of uncertain age, but favored to be chronic. 3. Soft tissue  swelling. Electronically Signed   By: Bary RichardStan  Maynard M.D.   On: 05/08/2016 11:41        Scheduled Meds: . arformoterol  15 mcg Nebulization BID  . azithromycin  250 mg Oral Daily  . diltiazem  180 mg Oral Daily  . guaiFENesin  600 mg Oral BID  . simvastatin  10 mg Oral q1800  . sodium chloride flush  3 mL Intravenous Q12H   Continuous Infusions: . sodium chloride 500 mL (05/09/16 0853)     LOS: 1 day    Time spent: 35min    Zannie CovePreetha Maija Biggers, MD [facility] Pager [phone]  If 7PM-7AM, please contact night-coverage [email or web address] Password [email or web address] 05/09/2016, 11:33 AM

## 2016-05-09 NOTE — Discharge Instructions (Signed)
Keep your left ankle splint clean and dry. Elevation as needed for swelling.  Absolutely no weight at all on your left ankle.

## 2016-05-10 LAB — BASIC METABOLIC PANEL WITH GFR
Anion gap: 7 (ref 5–15)
BUN: 11 mg/dL (ref 6–20)
CO2: 29 mmol/L (ref 22–32)
Calcium: 8.5 mg/dL — ABNORMAL LOW (ref 8.9–10.3)
Chloride: 93 mmol/L — ABNORMAL LOW (ref 101–111)
Creatinine, Ser: 0.63 mg/dL (ref 0.44–1.00)
GFR calc Af Amer: >60 mL/min
GFR calc non Af Amer: >60 mL/min
Glucose, Bld: 117 mg/dL — ABNORMAL HIGH (ref 65–99)
Potassium: 2.8 mmol/L — ABNORMAL LOW (ref 3.5–5.1)
Sodium: 129 mmol/L — ABNORMAL LOW (ref 135–145)

## 2016-05-10 MED ORDER — POTASSIUM CHLORIDE 10 MEQ/100ML IV SOLN: 10.0000 meq | INTRAVENOUS | Status: DC

## 2016-05-10 MED ORDER — POTASSIUM CHLORIDE CRYS ER 20 MEQ PO TBCR
40.0000 meq | EXTENDED_RELEASE_TABLET | Freq: Four times a day (QID) | ORAL | Status: AC
  Administered 2016-05-10 (×2): 40 meq via ORAL
  Filled 2016-05-10 (×2): qty 2

## 2016-05-10 MED ORDER — POTASSIUM CHLORIDE 10 MEQ/100ML IV SOLN
10.0000 meq | INTRAVENOUS | Status: AC
  Administered 2016-05-10 (×2): 10 meq via INTRAVENOUS
  Filled 2016-05-10 (×2): qty 100

## 2016-05-10 NOTE — Progress Notes (Addendum)
Documented to wrong patient

## 2016-05-10 NOTE — Progress Notes (Addendum)
PROGRESS NOTE    Heather Gaines  ZOX:[id number] DOB: September 14, 1924 DOA: 05/08/2016 PCP: Nadara Eaton, MD    Brief Narrative:  82 yo patient with PMH of Chronic bronchiectasis/MAI, Pulm HTN, HTN, who sustained a mechanical fall yesterday when she felt dizzy while up making her bed.  With left ankle pain, was taken to [facility] and found to have a left ankle fracture and hyponatremia/hypokalemia.  She was placed in a splint, but admitted to [facility]  Ortho consulted, splint and non weight bearing recommended  Assessment & Plan: 1. Dizziness -no syncope, could be due to dehydration from chlorthalidone, was hyponatremic on admission, now improving with IVF -stopped diuretic -Suspect symptoms may have been related to dehydration -Showing improvement with IV fluid resuscitation  2. Ankle fracture -Orthopedic consulted, Dr.Blackman recommended splint and Non weight bearing for 4-6weeks -Pt consulted -Plan for rehabilitation at skilled nursing facility  3. Hyponatremia -due to dehydration and chlorthalidone -stopped diuretic, may need alternate BP med, if BP trends up off chlorthalidone -cut down IVF  4. Hypokalemia  -Likely secondary to dehydration and diuretic, a.m. lab work showed potassium of 2.8 -Plan replace with IV potassium -Repeat labs in a.m.  5. Chronic bronchiectasis -with MAI, on chronic suppressive azithromycin, followed by Pulm at [facility] -this is stable, no active symptoms at this time  DVT prophylaxis:  lovenox  Code Status:    DNR/DNI  as per patient   Family Communication:   Family not at  Bedside Disposition Plan: Plan for discharge to skilled nursing facility in the next 24 hours   Consultants:  Ortho  Subjective: Reports feeling better today, denies dizziness, lightheadedness, nausea, vomiting, fevers, chills  Objective: Vitals:   05/10/16 0041 05/10/16 0456 05/10/16 0900 05/10/16 0921  BP:  (!) 154/61 (!) 165/66   Pulse: 79 78 97   Resp:  18 16     Temp:  97.7 F (36.5 C) 98 F (36.7 C)   TempSrc:  Oral Oral   SpO2: 96% 97% 97% 97%  Weight:      Height:        Intake/Output Summary (Last 24 hours) at 05/10/16 1327 Last data filed at 05/10/16 1236  Gross per 24 hour  Intake          2253.34 ml  Output             1925 ml  Net           328.34 ml   Filed Weights   05/08/16 1116  Weight: 59 kg (130 lb)    Examination:  General exam: Appears calm and comfortable, AAOx3, no distress Respiratory system: Clear to auscultation. Respiratory effort normal. Cardiovascular system: S1 & S2 heard, RRR. No JVD, murmurs, rubs, gallops or clicks. No pedal edema. Gastrointestinal system: Abdomen is nondistended, soft and nontender.  Central nervous system: Alert and oriented. No focal neurological deficits. Extremities: L ankle in cast Skin: No rashes, lesions or ulcers Psychiatry: Judgement and insight appear normal. Mood & affect appropriate.     Data Reviewed: I have personally reviewed following labs and imaging studies  CBC:  Recent Labs Lab 05/08/16 1145 05/09/16 0521  WBC 18.1* 10.4  NEUTROABS 16.0*  --   HGB 14.6 13.4  HCT 41.5 39.7  MCV 87.0 88.6  PLT 323 299   Basic Metabolic Panel:  Recent Labs Lab 05/08/16 1145 05/08/16 1934 05/08/16 2335 05/09/16 0521 05/09/16 0610 05/10/16 0607  NA 123*  --  126* 130* 130* 129*  K  2.9*  --  3.3* 2.9* 3.3* 2.8*  CL 79*  --  86* 91* 91* 93*  CO2 31  --  33* 30 31 29   GLUCOSE 112*  --  98 102* 110* 117*  BUN 25*  --  16 13 13 11   CREATININE 0.76  --  0.85 0.70 0.69 0.63  CALCIUM 9.1  --  8.5* 8.6* 8.5* 8.5*  MG  --   --   --  1.7  --   --   PHOS  --  2.5  --  1.9*  --   --    GFR: Estimated Creatinine Clearance: 39.4 mL/min (by C-G formula based on SCr of 0.63 mg/dL). Liver Function Tests:  Recent Labs Lab 05/09/16 0521  AST 21  ALT 21  ALKPHOS 55  BILITOT 1.1  PROT 5.2*  ALBUMIN 2.9*   No results for input(s): LIPASE, AMYLASE in the last 168  hours. No results for input(s): AMMONIA in the last 168 hours. Coagulation Profile: No results for input(s): INR, PROTIME in the last 168 hours. Cardiac Enzymes:  Recent Labs Lab 05/08/16 1934 05/08/16 2335 05/09/16 0521 05/09/16 1052  TROPONINI 0.03* <0.03 <0.03 <0.03   BNP (last 3 results) No results for input(s): PROBNP in the last 8760 hours. HbA1C: No results for input(s): HGBA1C in the last 72 hours. CBG: No results for input(s): GLUCAP in the last 168 hours. Lipid Profile: No results for input(s): CHOL, HDL, LDLCALC, TRIG, CHOLHDL, LDLDIRECT in the last 72 hours. Thyroid Function Tests:  Recent Labs  05/09/16 0521  TSH 1.000   Anemia Panel: No results for input(s): VITAMINB12, FOLATE, FERRITIN, TIBC, IRON, RETICCTPCT in the last 72 hours. Urine analysis:    Component Value Date/Time   COLORURINE YELLOW 05/09/2016 0030   APPEARANCEUR CLEAR 05/09/2016 0030   LABSPEC 1.006 05/09/2016 0030   PHURINE 7.5 05/09/2016 0030   GLUCOSEU NEGATIVE 05/09/2016 0030   HGBUR NEGATIVE 05/09/2016 0030   BILIRUBINUR NEGATIVE 05/09/2016 0030   KETONESUR NEGATIVE 05/09/2016 0030   PROTEINUR NEGATIVE 05/09/2016 0030   NITRITE NEGATIVE 05/09/2016 0030   LEUKOCYTESUR NEGATIVE 05/09/2016 0030   Sepsis Labs: @LABRCNTIP (procalcitonin:4,lacticidven:4)  ) Recent Results (from the past 240 hour(s))  MRSA PCR Screening     Status: None   Collection Time: 05/09/16 10:51 AM  Result Value Ref Range Status   MRSA by PCR NEGATIVE NEGATIVE Final    Comment:        The GeneXpert MRSA Assay (FDA approved for NASAL specimens only), is one component of a comprehensive MRSA colonization surveillance program. It is not intended to diagnose MRSA infection nor to guide or monitor treatment for MRSA infections.          Radiology Studies: No results found.      Scheduled Meds: . arformoterol  15 mcg Nebulization BID  . azithromycin  250 mg Oral Daily  . diltiazem  180 mg  Oral Daily  . enoxaparin (LOVENOX) injection  40 mg Subcutaneous Q24H  . guaiFENesin  600 mg Oral BID  . simvastatin  10 mg Oral q1800  . sodium chloride flush  3 mL Intravenous Q12H   Continuous Infusions: . sodium chloride 1,000 mL (05/09/16 1813)     LOS: 2 days    Time spent: 25 min    Zannie CovePreetha Joseph, MD [facility] Pager [phone]  If 7PM-7AM, please contact night-coverage [email or web address] Password [email or web address] 05/10/2016, 1:27 PM

## 2016-05-10 NOTE — Evaluation (Signed)
Physical Therapy Evaluation Patient Details Name: Heather Gaines MRN: [id number] DOB: 03/15/1925 Today's Date: 05/10/2016   History of Present Illness  pt is an 82 y./o female with pmh significant for pulmonary HTN, HTN, Raynaud's, admitted after mechanical fall due to dizziness and in the process twisting and fracturing her L ankle at the fibula.  Clinical Impression  Pt admitted with/for mechanical fall with ankle fx..  Pt currently limited functionally due to the problems listed. ( See problems list.)   Pt will benefit from PT to maximize function and safety in order to get ready for next venue listed below.     Follow Up Recommendations SNF    Equipment Recommendations  Wheelchair (measurements PT);Wheelchair cushion (measurements PT) (LxW  17 x 16 inches)    Recommendations for Other Services       Precautions / Restrictions Precautions Precautions: None      Mobility  Bed Mobility Overal bed mobility: Needs Assistance Bed Mobility: Supine to Sit;Sit to Supine     Supine to sit: Modified independent (Device/Increase time) Sit to supine: Modified independent (Device/Increase time)      Transfers Overall transfer level: Needs assistance   Transfers: Sit to/from Stand;Stand Pivot Transfers Sit to Stand: Min guard Stand pivot transfers: Min guard (x7)       General transfer comment: cues/demo's for safe procedure  Ambulation/Gait Ambulation/Gait assistance: Min assist Ambulation Distance (Feet): 2 Feet (forward and back) Assistive device: Rolling walker (2 wheeled) Gait Pattern/deviations: Step-to pattern     General Gait Details: swing to pattern  Stairs            Wheelchair Mobility    Modified Rankin (Stroke Patients Only)       Balance Overall balance assessment: Needs assistance   Sitting balance-Leahy Scale: Good     Standing balance support: Bilateral upper extremity supported Standing balance-Leahy Scale: Good                                Pertinent Vitals/Pain Pain Assessment: Faces Faces Pain Scale: Hurts a little bit Pain Location: left ankle Pain Descriptors / Indicators: Sore Pain Intervention(s): Monitored during session;Repositioned    Home Living Family/patient expects to be discharged to:: Private residence Living Arrangements: Alone   Type of Home: Apartment Home Access: Level entry     Home Layout: One level Home Equipment: None      Prior Function Level of Independence: Independent               Hand Dominance        Extremity/Trunk Assessment   Upper Extremity Assessment: Overall WFL for tasks assessed           Lower Extremity Assessment: Overall WFL for tasks assessed         Communication   Communication: No difficulties  Cognition Arousal/Alertness: Awake/alert Behavior During Therapy: WFL for tasks assessed/performed Overall Cognitive Status: Within Functional Limits for tasks assessed                      General Comments      Exercises     Assessment/Plan    PT Assessment All further PT needs can be met in the next venue of care  PT Problem List            PT Treatment Interventions      PT Goals (Current goals can be found in the Care Plan section)  Acute Rehab PT Goals Patient Stated Goal: get on with it...to SNF at [facility] PT Goal Formulation: With patient    Frequency     Barriers to discharge        Co-evaluation               End of Session   Activity Tolerance: Patient tolerated treatment well Patient left: in bed Nurse Communication: Mobility status         Time: 1113-1150 PT Time Calculation (min) (ACUTE ONLY): 37 min   Charges:   PT Evaluation $PT Eval Low Complexity: 1 Procedure PT Treatments $Therapeutic Activity: 8-22 mins   PT G Codes:        Lashelle Koy, Tessie Fass 05/10/2016, 12:19 PM 05/10/2016  Donnella Sham, PT [phone]  (pager)

## 2016-05-10 NOTE — Progress Notes (Signed)
OT Cancellation Note  Patient Details Name: Heather Gaines MRN: [id number] DOB: 01/06/1925   Cancelled Treatment:    Reason Eval/Treat Not Completed: Other (comment) (plan is for pt to d/c to SNF; will defer OT to next venue). Please re-consult if needs change. Thank you for this referral.  Gaye AlkenBailey A Taleyah Hillman M.S., OTR/L Pager: [phone]  05/10/2016, 1:34 PM

## 2016-05-11 ENCOUNTER — Inpatient Hospital Stay (HOSPITAL_COMMUNITY): Payer: Medicare Other

## 2016-05-11 ENCOUNTER — Other Ambulatory Visit (HOSPITAL_COMMUNITY): Payer: Medicare Other

## 2016-05-11 LAB — CBC
HCT: 38.5 % (ref 36.0–46.0)
Hemoglobin: 12.6 g/dL (ref 12.0–15.0)
MCH: 29.7 pg (ref 26.0–34.0)
MCHC: 32.7 g/dL (ref 30.0–36.0)
MCV: 90.8 fL (ref 78.0–100.0)
PLATELETS: 253 10*3/uL (ref 150–400)
RBC: 4.24 MIL/uL (ref 3.87–5.11)
RDW: 13.9 % (ref 11.5–15.5)
WBC: 10.5 10*3/uL (ref 4.0–10.5)

## 2016-05-11 LAB — BASIC METABOLIC PANEL
Anion gap: 7 (ref 5–15)
BUN: 12 mg/dL (ref 6–20)
CALCIUM: 8.4 mg/dL — AB (ref 8.9–10.3)
CO2: 29 mmol/L (ref 22–32)
Chloride: 94 mmol/L — ABNORMAL LOW (ref 101–111)
Creatinine, Ser: 0.65 mg/dL (ref 0.44–1.00)
GFR calc Af Amer: >60 mL/min (ref >60)
GLUCOSE: 117 mg/dL — AB (ref 65–99)
Potassium: 3.3 mmol/L — ABNORMAL LOW (ref 3.5–5.1)
SODIUM: 130 mmol/L — AB (ref 135–145)

## 2016-05-11 MED ORDER — POTASSIUM CHLORIDE CRYS ER 20 MEQ PO TBCR
40.0000 meq | EXTENDED_RELEASE_TABLET | Freq: Once | ORAL | Status: AC
  Administered 2016-05-11: 40 meq via ORAL
  Filled 2016-05-11: qty 2

## 2016-05-11 MED ORDER — HYDROCODONE-ACETAMINOPHEN 5-325 MG PO TABS: 1.0000 | ORAL_TABLET | Freq: Four times a day (QID) | ORAL | 0 refills | Status: DC | PRN

## 2016-05-11 MED ORDER — POTASSIUM CHLORIDE CRYS ER 20 MEQ PO TBCR
20.0000 meq | EXTENDED_RELEASE_TABLET | Freq: Once | ORAL | 0 refills | Status: AC
Start: 2016-05-11 — End: 2016-05-11

## 2016-05-11 MED ORDER — ONDANSETRON HCL 4 MG PO TABS: 4.0000 mg | ORAL_TABLET | Freq: Four times a day (QID) | ORAL | 0 refills | Status: AC | PRN

## 2016-05-11 MED ORDER — POLYETHYLENE GLYCOL 3350 17 G PO PACK: 17.0000 g | PACK | Freq: Every day | ORAL | 0 refills | Status: AC | PRN

## 2016-05-11 NOTE — Progress Notes (Signed)
Patient will DC to: [facility] Anticipated DC date: 05/11/16 Family notified: N/A patient Ox4 Transport by: Sharin MonsPTAR   Per MD patient ready for DC to [facility]. RN, patient, patient's family, and facility notified of DC. Discharge Summary sent to facility. RN given number for report. DC packet on chart. Ambulance transport requested for patient.   CSW signing off.  Cristobal GoldmannNadia Taro Hidrogo, [place] Clinical Social Worker [phone]

## 2016-05-11 NOTE — NC FL2 (Signed)
MEDICAID FL2 LEVEL OF CARE SCREENING TOOL     IDENTIFICATION  Patient Name: Heather Gaines Birthdate: 01-18-1925 Sex: female Admission Date (Current Location): 05/08/2016  [facility] and [place] Number:  Producer, television/film/video and Address:  [facility], [address] [place], [place] [address]      Provider Number: [id number]  Attending Physician Name and Address:  Jeralyn Bennett, MD  Relative Name and Phone Number:       Current Level of Care: Hospital Recommended Level of Care: Skilled Nursing Facility Prior Approval Number:    Date Approved/Denied:   PASRR Number: [id number]  Discharge Plan: SNF    Current Diagnoses: Patient Active Problem List   Diagnosis Date Noted  . Syncope 05/08/2016  . Ankle fracture, left 05/08/2016  . Hypokalemia 05/08/2016  . Hyponatremia 05/08/2016  . Leukocytosis 05/08/2016    Orientation RESPIRATION BLADDER Height & Weight     Self, Time, Situation, Place  Normal Continent Weight: 59 kg (130 lb) Height:  5\' 3"  (160 cm)  BEHAVIORAL SYMPTOMS/MOOD NEUROLOGICAL BOWEL NUTRITION STATUS      Continent Diet (Please see DC Summary)  AMBULATORY STATUS COMMUNICATION OF NEEDS Skin   Extensive Assist Verbally Normal                       Personal Care Assistance Level of Assistance  Bathing, Feeding, Dressing Bathing Assistance: Limited assistance Feeding assistance: Independent Dressing Assistance: Limited assistance     Functional Limitations Info  Hearing   Hearing Info: Impaired      SPECIAL CARE FACTORS FREQUENCY  PT (By licensed PT)     PT Frequency: 5x/week              Contractures      Additional Factors Info  Code Status, Allergies Code Status Info: DNR Allergies Info: Sulfa Antibiotics           Current Medications (05/11/2016):  This is the current hospital active medication list Current Facility-Administered Medications  Medication Dose Route Frequency Provider Last  Rate Last Dose  . 0.9 %  sodium chloride infusion   Intravenous Continuous Zannie Cove, MD 50 mL/hr at 05/10/16 1508    . acetaminophen (TYLENOL) tablet 650 mg  650 mg Oral Q6H PRN Therisa Doyne, MD       Or  . acetaminophen (TYLENOL) suppository 650 mg  650 mg Rectal Q6H PRN Therisa Doyne, MD      . albuterol (PROVENTIL) (2.5 MG/3ML) 0.083% nebulizer solution 2.5 mg  2.5 mg Inhalation Q4H PRN Therisa Doyne, MD      . arformoterol (BROVANA) nebulizer solution 15 mcg  15 mcg Nebulization BID Therisa Doyne, MD   15 mcg at 05/11/16 0809  . azithromycin (ZITHROMAX) tablet 250 mg  250 mg Oral Daily Therisa Doyne, MD   250 mg at 05/11/16 0958  . bisacodyl (DULCOLAX) suppository 10 mg  10 mg Rectal Daily PRN Therisa Doyne, MD      . diltiazem (CARDIZEM CD) 24 hr capsule 180 mg  180 mg Oral Daily Therisa Doyne, MD   180 mg at 05/11/16 0958  . enoxaparin (LOVENOX) injection 40 mg  40 mg Subcutaneous Q24H Zannie Cove, MD   40 mg at 05/10/16 1235  . guaiFENesin (MUCINEX) 12 hr tablet 600 mg  600 mg Oral BID Therisa Doyne, MD   600 mg at 05/11/16 0958  . HYDROcodone-acetaminophen (NORCO/VICODIN) 5-325 MG per tablet 1-2 tablet  1-2 tablet Oral Q4H PRN  Therisa DoyneAnastassia Doutova, MD      . ondansetron (ZOFRAN) tablet 4 mg  4 mg Oral Q6H PRN Therisa DoyneAnastassia Doutova, MD       Or  . ondansetron (ZOFRAN) injection 4 mg  4 mg Intravenous Q6H PRN Therisa DoyneAnastassia Doutova, MD      . polyethylene glycol (MIRALAX / GLYCOLAX) packet 17 g  17 g Oral Daily PRN Therisa DoyneAnastassia Doutova, MD      . simvastatin (ZOCOR) tablet 10 mg  10 mg Oral q1800 Therisa DoyneAnastassia Doutova, MD   10 mg at 05/10/16 1745  . sodium chloride flush (NS) 0.9 % injection 3 mL  3 mL Intravenous Q12H Therisa DoyneAnastassia Doutova, MD   3 mL at 05/10/16 2158     Discharge Medications: Please see discharge summary for a list of discharge medications.  Relevant Imaging Results:  Relevant Lab Results:   Additional Information SSN: [id number]  [id number]  Izza Bickle S DundeeRayyan, [place]

## 2016-05-11 NOTE — Clinical Social Work Placement (Signed)
   CLINICAL SOCIAL WORK PLACEMENT  NOTE  Date:  05/11/2016  Patient Details  Name: Zion Guevara MRN: [id number] Date of Birth: 06/01/1925  Clinical Social Work is seeking post-discharge placement for this patient at the Skilled  Nursing Facility level of care (*CSW will initial, date and re-position this form in  chart as items are completed):  Yes   Patient/family provided with [place] Clinical Social Work Department's list of facilities offering this level of care within the geographic area requested by the patient (or if unable, by the patient's family).  Yes   Patient/family informed of their freedom to choose among providers that offer the needed level of care, that participate in Medicare, Medicaid or managed care program needed by the patient, have an available bed and are willing to accept the patient.  Yes   Patient/family informed of [facility]'s ownership interest in [facility] and [facility], as well as of the fact that they are under no obligation to receive care at these facilities.  PASRR submitted to EDS on       PASRR number received on       Existing PASRR number confirmed on 05/11/16     FL2 transmitted to all facilities in geographic area requested by pt/family on 05/11/16     FL2 transmitted to all facilities within larger geographic area on       Patient informed that his/her managed care company has contracts with or will negotiate with certain facilities, including the following:        Yes   Patient/family informed of bed offers received.  [facility]     Physician recommends and patient chooses bed at      [facility] 05/11/16.  Patient to be transferred to facility by PTAR     Patient family notified on 05/11/16 of transfer.  Name of family member notified:  Patient contacting her son     PHYSICIAN Please sign FL2     Additional Comment:     _______________________________________________ Mearl LatinNadia S Barrington Worley, LCSWA 05/11/2016, 10:12 AM

## 2016-05-11 NOTE — Progress Notes (Signed)
Suzzane Ladona Ridgelaylor to be D/C'd [facility] per MD order.  Discussed with the patient and all questions fully answered.  VSS, Skin clean, dry and intact without evidence of skin break down, no evidence of skin tears noted. IV catheter discontinued intact. Site without signs and symptoms of complications. Dressing and pressure applied.  An After Visit Summary was printed and given to the patient. PTAR received prescription.  D/c education completed with patient/family including follow up instructions, medication list, d/c activities limitations if indicated, with other d/c instructions as indicated by MD - patient able to verbalize understanding, all questions fully answered.   Patient instructed to return to ED, call 911, or call MD for any changes in condition.   Patient escorted via stretcher, and D/C to SNF via PTAR.  Joellyn HaffKayla L Price 05/11/2016 2:20 PM

## 2016-05-11 NOTE — Care Management Important Message (Signed)
Important Message  Patient Details  Name: Heather Gaines MRN: [id number] Date of Birth: 12/30/1924   Medicare Important Message Given:  Yes    [name] 05/11/2016, 3:23 PM

## 2016-05-11 NOTE — Clinical Social Work Note (Signed)
Clinical Social Work Assessment  Patient Details  Name: Heather Gaines MRN: [id number] Date of Birth: [birth year]  Date of referral:  05/11/16               Reason for consult:  Facility Placement                Permission sought to share information with:  Facility Sport and exercise psychologist, Family Supports Permission granted to share information::  Yes, Verbal Permission Granted  Name::     Tom  Agency::  [facility]  Relationship::  Son  Contact Information:  [phone]  Housing/Transportation Living arrangements for the past 2 months:  [place] of Information:  Patient Patient Interpreter Needed:  None Criminal Activity/Legal Involvement Pertinent to Current Situation/Hospitalization:  No - Comment as needed Significant Relationships:  Adult Children Lives with:  Facility Resident, Self Do you feel safe going back to the place where you live?  No Need for family participation in patient care:  No (Coment)  Care giving concerns:  CSW received consult for possible SNF placement at time of discharge. CSW met with patient regarding PT recommendation of SNF placement at time of discharge. Patient lives at [facility] but would like rehab before returning home. Patient expressed understanding of PT recommendation and is agreeable to SNF placement at time of discharge. CSW to continue to follow and assist with discharge planning needs.   Social Worker assessment / plan:  CSW spoke with patient concerning possibility of rehab at [facility] before returning home.  Employment status:  Retired Forensic scientist:  Medicare PT Recommendations:  [place] / Referral to community resources:  [place]  Patient/Family's Response to care:  Patient recognizes need for rehab before returning home and is agreeable to a SNF at [employer] [facility] [employer].  Patient/Family's Understanding of and Emotional Response to Diagnosis, Current  Treatment, and Prognosis:  Patient/family is realistic regarding therapy needs and expressed being hopeful for SNF placement. Patient expressed understanding of CSW role and discharge process. No questions/concerns about plan or treatment.    Emotional Assessment Appearance:  Appears younger than stated age Attitude/Demeanor/Rapport:  Other (Appropriate) Affect (typically observed):  Accepting, Appropriate, Pleasant Orientation:  Oriented to Self, Oriented to Place, Oriented to  Time, Oriented to Situation Alcohol / Substance use:  Not Applicable Psych involvement (Current and /or in the community):  No (Comment)  Discharge Needs  Concerns to be addressed:  Care Coordination Readmission within the last 30 days:  No Current discharge risk:  None Barriers to Discharge:  No Barriers Identified   Benard Halsted, [place] 05/11/2016, 10:09 AM

## 2016-05-11 NOTE — Discharge Summary (Signed)
Physician Discharge Summary  Heather Gaines ZOX:[id number] DOB: 1924-11-09 DOA: 05/08/2016  PCP: Heather Eaton, MD  Admit date: 05/08/2016 Discharge date: 05/11/2016  Time spent: 35 minutes  Recommendations for Outpatient Follow-up:  1. Please check a BMP in 2-3 days, she was hypokalemic and hyponatremic during this hospitalization, she was discharged on Kdur 20 mEq by mouth daily 2. Please monitor volume status, chlorthalidone was discontinued on admission due to probable dehydration 3. She has a history of MAI and is curly being treated with azithromycin 250 mg by mouth daily 4. Ice packs to left ankle as needed 5. Nonweightbearing status to left ankle 4-6 weeks   Discharge Diagnoses:  Active Problems:   Syncope   Ankle fracture, left   Hypokalemia   Hyponatremia   Leukocytosis   Discharge Condition: Stable  Diet recommendation: Heart healthy  Filed Weights   05/08/16 1116  Weight: 59 kg (130 lb)    History of present illness:  Heather Gaines is a 82 y.o. female with medical history significant of Mycobacterium avium-intracellulare, Pulmonary HTN, AMD, HTN, HLD, Raynaud's phenomenom, and Bronchiectasis asthma as an infant   Presented with mechanical fall this morning after she leaned over to fix her bed. She felt lihghtheaded and felt her self falling unable to catch her self. Her left ankle got entrapped by the bed and twisted.  She's been feeling lightheaded prior to this. After falling she reported significant left ankle pain. Patient reports near syncopal episode prior to the fall no chest pain , o fever she has chronic cough she has been on oxygen in the past but not recently. In emergency department patient was found to have sodium of 123 potassium 2.9 she was given IV fluids and potassium was replaced. Patient reports she has been eating well and drinks about 9 glasses a day of water. Denies nausea vomiting or diarrhea  Regarding her bronchiectasis patient is on chronic  azithromycin just finished a course of prednisone and Ceftindir Regarding pertinent Chronic problems: Patient has known history of bronchiectasis with recurrent exacerbations secondary to MAI followed by pulmonology at [facility] Course:  Heather Gaines is a pleasant 82 year old female with multiple comorbidities including chronic bronchiectasis/MAI on azithromycin, pulmonary hypertension, hypertension, admitted to medicine service on 05/08/2016 after having a fall at her facility. She reported feeling dizzy/lightheaded while making her bed and having a fall injuring her left ankle. Imaging studies showed left ankle fracture. She was also found to have hyponatremia and hypokalemia. On presentation she appeared dehydrated which would correlate with electrolyte abnormalities. Her diuretic chlorthalidone was discontinued as she was started on gentle IV fluid resuscitation and given a likely replacement. With regard to ankle fracture, Dr. Magnus Gaines orthopedic surgery was consulted. He recommended splint with nonweightbearing status for 4-6 weeks. Did not recommend surgical intervention at this time. During this hospitalization she was evaluated by physical therapy and recommended rehabilitation at skilled nursing facility. By 05/11/2016 she felt much better and was stable for discharge. She was discharged to skilled nursing facility on this date.  Consultations:  Dr. Magnus Gaines orthopedic surgery  Physical therapy  Discharge Exam: Vitals:   05/11/16 0810 05/11/16 0958  BP:  (!) 148/56  Pulse: 92   Resp: 16   Temp:      General exam: Appears calm and comfortable, AAOx3, no distress Respiratory system: Clear to auscultation. Respiratory effort normal. Cardiovascular system: S1 & S2 heard, RRR. No JVD, murmurs, rubs, gallops or clicks. No pedal edema. Gastrointestinal system: Abdomen is nondistended,  soft and nontender.  Central nervous system: Alert and oriented. No focal neurological  deficits. Extremities: L ankle in cast Skin: No rashes, lesions or ulcers Psychiatry: Judgement and insight appear normal. Mood & affect appropriate.   Discharge Instructions   Discharge Instructions    Call MD for:    Complete by:  As directed    Call MD for:  difficulty breathing, headache or visual disturbances    Complete by:  As directed    Call MD for:  extreme fatigue    Complete by:  As directed    Call MD for:  hives    Complete by:  As directed    Call MD for:  persistant dizziness or light-headedness    Complete by:  As directed    Call MD for:  persistant nausea and vomiting    Complete by:  As directed    Call MD for:  redness, tenderness, or signs of infection (pain, swelling, redness, odor or green/yellow discharge around incision site)    Complete by:  As directed    Call MD for:  severe uncontrolled pain    Complete by:  As directed    Call MD for:  temperature >100.4    Complete by:  As directed    Diet - low sodium heart healthy    Complete by:  As directed    Increase activity slowly    Complete by:  As directed      Current Discharge Medication List    START taking these medications   Details  HYDROcodone-acetaminophen (NORCO/VICODIN) 5-325 MG tablet Take 1-2 tablets by mouth every 6 (six) hours as needed for moderate pain. Qty: 12 tablet, Refills: 0    ondansetron (ZOFRAN) 4 MG tablet Take 1 tablet (4 mg total) by mouth every 6 (six) hours as needed for nausea. Qty: 20 tablet, Refills: 0    polyethylene glycol (MIRALAX / GLYCOLAX) packet Take 17 g by mouth daily as needed for mild constipation. Qty: 14 each, Refills: 0    potassium chloride SA (K-DUR,KLOR-CON) 20 MEQ tablet Take 1 tablet (20 mEq total) by mouth once. Qty: 5 tablet, Refills: 0      CONTINUE these medications which have NOT CHANGED   Details  albuterol (PROVENTIL HFA;VENTOLIN HFA) 108 (90 Base) MCG/ACT inhaler Inhale 2 puffs into the lungs every 4 (four) hours as needed for  wheezing or shortness of breath.    azithromycin (ZITHROMAX) 250 MG tablet Take 250 mg by mouth daily.     diltiazem (DILACOR XR) 180 MG 24 hr capsule Take 180 mg by mouth daily.    salmeterol (SEREVENT) 50 MCG/DOSE diskus inhaler Inhale 1 puff into the lungs 2 (two) times daily.    simvastatin (ZOCOR) 10 MG tablet Take 10 mg by mouth daily.      STOP taking these medications     chlorthalidone (HYGROTON) 25 MG tablet        Allergies  Allergen Reactions  . Sulfa Antibiotics    Follow-up Information    Kathryne Hitch, MD. Schedule an appointment as soon as possible for a visit in 1 week(s).   Specialty:  Orthopedic Surgery Contact information: [address] [place] [address] [phone]        Heather Eaton, MD Follow up in 1 week(s).   Specialty:  Internal Medicine           The results of significant diagnostics from this hospitalization (including imaging, microbiology, ancillary and laboratory) are listed  below for reference.    Significant Diagnostic Studies: Dg Ankle Complete Left  Result Date: 05/08/2016 CLINICAL DATA:  Fall, ankle injury. EXAM: LEFT ANKLE COMPLETE - 3+ VIEW COMPARISON:  None. FINDINGS: There is a slightly displaced obliquely-oriented fracture of the distal left fibula. Tiny avulsion fracture fragment is seen along the inferior margin of the medial malleolus, of uncertain age. Distal tibia otherwise intact and normally aligned. Ankle mortise is symmetric. Visualized portions of the hindfoot and midfoot appear intact and normally aligned. Prominent soft tissue swelling noted over the lateral malleolus. IMPRESSION: 1. Slightly displaced, obliquely oriented, fracture of the distal left fibula. 2. Tiny avulsion fracture fragment immediately subjacent to the medial malleolus, of uncertain age, but favored to be chronic. 3. Soft tissue swelling. Electronically Signed   By: Bary RichardStan  Maynard M.D.   On: 05/08/2016 11:41     Microbiology: Recent Results (from the past 240 hour(s))  MRSA PCR Screening     Status: None   Collection Time: 05/09/16 10:51 AM  Result Value Ref Range Status   MRSA by PCR NEGATIVE NEGATIVE Final    Comment:        The GeneXpert MRSA Assay (FDA approved for NASAL specimens only), is one component of a comprehensive MRSA colonization surveillance program. It is not intended to diagnose MRSA infection nor to guide or monitor treatment for MRSA infections.      Labs: Basic Metabolic Panel:  Recent Labs Lab 05/08/16 1934 05/08/16 2335 05/09/16 0521 05/09/16 0610 05/10/16 0607 05/11/16 0455  NA  --  126* 130* 130* 129* 130*  K  --  3.3* 2.9* 3.3* 2.8* 3.3*  CL  --  86* 91* 91* 93* 94*  CO2  --  33* 30 31 29 29   GLUCOSE  --  98 102* 110* 117* 117*  BUN  --  16 13 13 11 12   CREATININE  --  0.85 0.70 0.69 0.63 0.65  CALCIUM  --  8.5* 8.6* 8.5* 8.5* 8.4*  MG  --   --  1.7  --   --   --   PHOS 2.5  --  1.9*  --   --   --    Liver Function Tests:  Recent Labs Lab 05/09/16 0521  AST 21  ALT 21  ALKPHOS 55  BILITOT 1.1  PROT 5.2*  ALBUMIN 2.9*   No results for input(s): LIPASE, AMYLASE in the last 168 hours. No results for input(s): AMMONIA in the last 168 hours. CBC:  Recent Labs Lab 05/08/16 1145 05/09/16 0521 05/11/16 0455  WBC 18.1* 10.4 10.5  NEUTROABS 16.0*  --   --   HGB 14.6 13.4 12.6  HCT 41.5 39.7 38.5  MCV 87.0 88.6 90.8  PLT 323 299 253   Cardiac Enzymes:  Recent Labs Lab 05/08/16 1934 05/08/16 2335 05/09/16 0521 05/09/16 1052  TROPONINI 0.03* <0.03 <0.03 <0.03   BNP: BNP (last 3 results) No results for input(s): BNP in the last 8760 hours.  ProBNP (last 3 results) No results for input(s): PROBNP in the last 8760 hours.  CBG: No results for input(s): GLUCAP in the last 168 hours.     Signed:  Jeralyn BennettZAMORA, Ilona Colley MD.  [facility] 05/11/2016, 12:40 PM

## 2016-05-17 ENCOUNTER — Ambulatory Visit (INDEPENDENT_AMBULATORY_CARE_PROVIDER_SITE_OTHER): Payer: No Typology Code available for payment source | Admitting: Orthopaedic Surgery

## 2016-05-17 DIAGNOSIS — S8265XD Nondisplaced fracture of lateral malleolus of left fibula, subsequent encounter for closed fracture with routine healing: Secondary | ICD-10-CM

## 2016-05-31 ENCOUNTER — Ambulatory Visit (INDEPENDENT_AMBULATORY_CARE_PROVIDER_SITE_OTHER): Payer: Medicare Other | Admitting: Orthopaedic Surgery

## 2016-05-31 DIAGNOSIS — S8265XA Nondisplaced fracture of lateral malleolus of left fibula, initial encounter for closed fracture: Secondary | ICD-10-CM

## 2016-06-28 ENCOUNTER — Ambulatory Visit (INDEPENDENT_AMBULATORY_CARE_PROVIDER_SITE_OTHER): Payer: Medicare Other | Admitting: Orthopaedic Surgery

## 2016-06-28 ENCOUNTER — Ambulatory Visit (INDEPENDENT_AMBULATORY_CARE_PROVIDER_SITE_OTHER): Payer: Medicare Other

## 2016-06-28 DIAGNOSIS — S82892S Other fracture of left lower leg, sequela: Secondary | ICD-10-CM

## 2016-06-28 NOTE — Progress Notes (Signed)
   Office Visit Note   Patient: Heather Gaines           Date of Birth: 10/15/1924           MRN: [id number] Visit Date: 06/28/2016   ----             Requested by: Nadara EatonMichael J Piazza, MD No address on file PCP: Nadara EatonPIAZZA, MICHAEL J, MD   Assessment & Plan: Visit Diagnoses:  1. Closed fracture of left ankle, sequela     Plan: Discontinue ASO brace except for outside use for the next 2-3 weeks and then completely discontinued.  Follow-Up Instructions: Return if symptoms worsen or fail to improve.   Orders:  Orders Placed This Encounter  Procedures  . XR Ankle 2 Views Left   No orders of the defined types were placed in this encounter.     Procedures: No procedures performed   Clinical Data: No additional findings.   Subjective: No chief complaint on file.   Heather Gaines now 2 months status post conservative treatment of a nondisplaced left ankle lateral malleolus fracture. She states overall she is doing well and has no complaints in regards to the left ankle. She continues to wear an ASO brace.    Review of Systems  See HPI  Objective: Vital Signs: There were no vitals taken for this visit.  Physical Exam  Ortho Exam Ankle full dorsiflexion plantar flexion. Good inversion and eversion with 5 out of 5 strengths with against resisted. Swelling of the left lower leg without Pain dorsal pedal pulses present Specialty Comments:  No specialty comments available.  Imaging: Xr Ankle 2 Views Left  Result Date: 06/28/2016 Mortise and lateral view of the left ankle: Talus well located within the ankle mortise no diastases. The lateral malleolus fracture remains in excellent position alignment with good.callus formation almost completely healed.    PMFS History: Patient Active Problem List   Diagnosis Date Noted  . Syncope 05/08/2016  . Ankle fracture, left 05/08/2016  . Hypokalemia 05/08/2016  . Hyponatremia 05/08/2016  . Leukocytosis 05/08/2016   Past Medical  History:  Diagnosis Date  . Bronchiectasis (HCC)   . Hyperlipidemia   . Hypertension   . MAI (mycobacterium avium-intracellulare) (HCC)   . Pulmonary hypertension     Family History  Problem Relation Age of Onset  . Diabetes Mother   . Hypertension Mother   . Hypertension Sister   . CAD Sister   . Breast cancer Sister   . Melanoma Sister   . Cancer Brother   . Stroke Neg Hx     No past surgical history on file. Social History   Occupational History  . Not on file.   Social History Main Topics  . Smoking status: Never Smoker  . Smokeless tobacco: Never Used  . Alcohol use Yes     Comment: occasional  . Drug use: No  . Sexual activity: Not Currently

## 2017-04-14 ENCOUNTER — Emergency Department (HOSPITAL_BASED_OUTPATIENT_CLINIC_OR_DEPARTMENT_OTHER): Payer: Medicare Other

## 2017-04-14 ENCOUNTER — Encounter (HOSPITAL_BASED_OUTPATIENT_CLINIC_OR_DEPARTMENT_OTHER): Payer: Self-pay | Admitting: Emergency Medicine

## 2017-04-14 ENCOUNTER — Emergency Department (HOSPITAL_BASED_OUTPATIENT_CLINIC_OR_DEPARTMENT_OTHER)
Admission: EM | Admit: 2017-04-14 | Discharge: 2017-04-15 | Disposition: A | Discharge status: Home / Self Care | Payer: Medicare Other | Attending: Emergency Medicine | Admitting: Emergency Medicine

## 2017-04-14 DIAGNOSIS — Y9289 Other specified places as the place of occurrence of the external cause: Secondary | ICD-10-CM | POA: Insufficient documentation

## 2017-04-14 DIAGNOSIS — W01198A Fall on same level from slipping, tripping and stumbling with subsequent striking against other object, initial encounter: Secondary | ICD-10-CM | POA: Insufficient documentation

## 2017-04-14 DIAGNOSIS — Z79899 Other long term (current) drug therapy: Secondary | ICD-10-CM | POA: Diagnosis not present

## 2017-04-14 DIAGNOSIS — T148XXA Other injury of unspecified body region, initial encounter: Secondary | ICD-10-CM

## 2017-04-14 DIAGNOSIS — Y9389 Activity, other specified: Secondary | ICD-10-CM | POA: Insufficient documentation

## 2017-04-14 DIAGNOSIS — S42295A Other nondisplaced fracture of upper end of left humerus, initial encounter for closed fracture: Secondary | ICD-10-CM | POA: Diagnosis not present

## 2017-04-14 DIAGNOSIS — S0990XA Unspecified injury of head, initial encounter: Secondary | ICD-10-CM | POA: Diagnosis not present

## 2017-04-14 DIAGNOSIS — Y999 Unspecified external cause status: Secondary | ICD-10-CM | POA: Insufficient documentation

## 2017-04-14 DIAGNOSIS — I10 Essential (primary) hypertension: Secondary | ICD-10-CM | POA: Diagnosis not present

## 2017-04-14 DIAGNOSIS — S4992XA Unspecified injury of left shoulder and upper arm, initial encounter: Secondary | ICD-10-CM | POA: Diagnosis present

## 2017-04-14 DIAGNOSIS — S42202A Unspecified fracture of upper end of left humerus, initial encounter for closed fracture: Secondary | ICD-10-CM

## 2017-04-14 HISTORY — DX: Unspecified hearing loss, unspecified ear: H91.90

## 2017-04-14 NOTE — ED Triage Notes (Addendum)
Patient tripped on uneven sidewalk. C/O pain to left shoulder. Denies hitting head although there is an abrasion and bruising to the left side of her head, denies LOC. Blood to pants, patient states this is not her blood and is unrelated to the fall.

## 2017-04-14 NOTE — ED Provider Notes (Signed)
[facility] Provider Note   CSN: [id number] Arrival date & time: 04/14/17  2156     History   Chief Complaint Chief Complaint  Patient presents with  . Fall    L shoulder pain    HPI Heather Gaines is a 83 y.o. female who presents with L shoulder pain that began tonight after a mechanical fall. Patient states that she was walking out of a restaurant after it had just been Raining when she slipped and fell, landing on her left shoulder and hitting the left side of her head. Patient denies any LOC. She states that she is able to recall the entire event. Patient was able to get up with assistance. Patient has been able to ambulate since the incident. On ED arrival, patient reports left shoulder pain. She reports difficulty moving the left shoulder, secondary to pain. Patient denies any elbow, forearm, wrist pain. She is able to move the hand without any difficulty. Patient also reports that she hit the left side of her head. She has a small area of ecchymosis and swelling to the left frontal region. Patient denies any vision changes, chest pain, difficulty breathing, abdominal pain, numbness/weakness.  The history is provided by the patient.    Past Medical History:  Diagnosis Date  . Bronchiectasis (HCC)   . HOH (hard of hearing)   . Hyperlipidemia   . Hypertension   . MAI (mycobacterium avium-intracellulare) (HCC)   . Pulmonary hypertension [facility](HCC)     Patient Active Problem List   Diagnosis Date Noted  . Syncope 05/08/2016  . Ankle fracture, left 05/08/2016  . Hypokalemia 05/08/2016  . Hyponatremia 05/08/2016  . Leukocytosis 05/08/2016    Past Surgical History:  Procedure Laterality Date  . BREAST BIOPSY      OB History    No data available       Home Medications    Prior to Admission medications   Medication Sig Start Date End Date Taking? Authorizing Provider  albuterol (PROVENTIL HFA;VENTOLIN HFA) 108 (90 Base) MCG/ACT inhaler Inhale 2 puffs into the  lungs every 4 (four) hours as needed for wheezing or shortness of breath.    [provider]  azithromycin (ZITHROMAX) 250 MG tablet Take 250 mg by mouth daily.     [provider]  diltiazem (DILACOR XR) 180 MG 24 hr capsule Take 180 mg by mouth daily.    [provider]  HYDROcodone-acetaminophen (NORCO/VICODIN) 5-325 MG tablet Take 1-2 tablets by mouth every 6 (six) hours as needed. 04/15/17   Maxwell CaulLayden, Lindsey A, PA-C  ondansetron (ZOFRAN) 4 MG tablet Take 1 tablet (4 mg total) by mouth every 6 (six) hours as needed for nausea. 05/11/16   Jeralyn BennettZamora, Ezequiel, MD  polyethylene glycol [facility](MIRALAX / Ethelene HalGLYCOLAX) packet Take 17 g by mouth daily as needed for mild constipation. 05/11/16   Jeralyn BennettZamora, Ezequiel, MD  potassium chloride SA (K-DUR,KLOR-CON) 20 MEQ tablet Take 1 tablet (20 mEq total) by mouth once. 05/11/16 05/11/16  Jeralyn BennettZamora, Ezequiel, MD  salmeterol (SEREVENT) 50 MCG/DOSE diskus inhaler Inhale 1 puff into the lungs 2 (two) times daily.    [provider]  simvastatin (ZOCOR) 10 MG tablet Take 10 mg by mouth daily.    [provider]    Family History Family History  Problem Relation Age of Onset  . Diabetes Mother   . Hypertension Mother   . Hypertension Sister   . CAD Sister   . Breast cancer Sister   . Melanoma Sister   .  Cancer Brother   . Stroke Neg Hx     Social History Social History  Substance Use Topics  . Smoking status: Never Smoker  . Smokeless tobacco: Never Used  . Alcohol use Yes     Comment: occasional     Allergies   Sulfa antibiotics   Review of Systems Review of Systems  Constitutional: Negative for fever.  Eyes: Negative for visual disturbance.  Respiratory: Negative for shortness of breath.   Cardiovascular: Negative for chest pain.  Gastrointestinal: Negative for abdominal pain, nausea and vomiting.  Musculoskeletal:       Left shoulder pain  Skin: Positive for color change.  Neurological: Negative for weakness and  numbness.     Physical Exam Updated Vital Signs BP (!) 119/52 (BP Location: Right Arm)   Pulse 79   Temp 98.3 F (36.8 C) (Oral)   Resp 19   Ht 5\' 3"  (1.6 m)   Wt 59 kg (130 lb)   SpO2 94%   BMI 23.03 kg/m   Physical Exam  Constitutional: She is oriented to person, place, and time. She appears well-developed and well-nourished.  Elderly and frail appearing   HENT:  Head: Normocephalic. Head is without raccoon's eyes and without Battle's sign.  Mouth/Throat: Uvula is midline, oropharynx is clear and moist and mucous membranes are normal.  Area of soft tissue swelling and ecchymosis noted to the left frontal region. No deformities or crepitus noted. No open wounds, abrasions or lacerations.   Eyes: Pupils are equal, round, and reactive to light. Conjunctivae and EOM are normal. Right eye exhibits no discharge. Left eye exhibits no discharge. No scleral icterus.  No periorbital tenderness bilaterally. EOMs intact without any difficulty.  Neck: Full passive range of motion without pain. Neck supple. No spinous process tenderness and no muscular tenderness present. Normal range of motion present.  Full flexion/extension and lateral movement of neck fully intact. No bony midline tenderness. No deformities or crepitus.   Cardiovascular: Normal rate, regular rhythm and normal pulses.   Pulses:      Radial pulses are 2+ on the right side, and 2+ on the left side.  Pulmonary/Chest: Effort normal and breath sounds normal.  No evidence of respiratory distress. Able to speak in full sentences without difficulty. No anterior chest wall tenderness. No deformity to the cough or anterior chest.  Abdominal: Soft. Normal appearance. She exhibits no distension. There is no tenderness. There is no rigidity, no rebound and no guarding.  Musculoskeletal:       Thoracic back: She exhibits no tenderness.       Lumbar back: She exhibits no tenderness.  Tenderness palpation overlying the anterior aspect of  the left shoulder with palpable crepitus or deformity. Limited range of motion of left shoulder secondary to pain. Flexion and extension of left elbow intact without difficulty. Full range of motion of left wrist intact without difficulty. Full flexion/extension of all 5 digits of left hand intact without difficulty. Full range of motion of right upper extremity without difficulty. BLE are symmetric in appearance, no evidence of shortening or rotation. Full range of motion to bilateral lower extremities without any difficulty. No tenderness or deformities to bilateral hips, knees, ankles, feet.  Neurological: She is alert and oriented to person, place, and time. GCS eye subscore is 4. GCS verbal subscore is 5. GCS motor subscore is 6.  Cranial nerves III-XII intact Follows commands, Moves all extremities  5/5 strength to RUE and BLE, limited strength to LUE secondary to  pain but bilateral grip strength intact and is symmetric. Sensation intact throughout all major nerve distributions Unable to assess finger to nose, dysdiadokinesia and pronator drift on left secondary to patient's left shoulder pain that No slurred speech. No facial droop.  Normal gait   Skin: Skin is warm and dry.  Psychiatric: She has a normal mood and affect. Her speech is normal and behavior is normal.  Nursing note and vitals reviewed.    ED Treatments / Results  Labs (all labs ordered are listed, but only abnormal results are displayed) Labs Reviewed - No data to display  EKG  EKG Interpretation None       Radiology Ct Head Wo Contrast  Result Date: 04/15/2017 CLINICAL DATA:  Trip and fall injury tonight. EXAM: CT HEAD WITHOUT CONTRAST TECHNIQUE: Contiguous axial images were obtained from the base of the skull through the vertex without intravenous contrast. COMPARISON:  None. FINDINGS: Brain: There is no intracranial hemorrhage, mass or evidence of acute infarction. There is moderate generalized atrophy. There is  extensive chronic microvascular ischemic change. There is no significant extra-axial fluid collection. No acute intracranial findings are evident. Vascular: No hyperdense vessel or unexpected calcification. Skull: Normal. Negative for fracture or focal lesion. Sinuses/Orbits: No acute finding. Other: Left frontal scalp soft tissue swelling. IMPRESSION: No acute intracranial findings. There is moderate generalized atrophy and chronic appearing white matter hypodensities which likely represent small vessel ischemic disease. Electronically Signed   By: Ellery Plunk M.D.   On: 04/15/2017 00:02   Dg Shoulder Left  Result Date: 04/15/2017 CLINICAL DATA:  Larey Seat with left shoulder pain and decreased range of motion EXAM: LEFT SHOULDER - 2+ VIEW COMPARISON:  None. FINDINGS: Acute slightly comminuted fracture involving the left humeral head and neck with probable involvement of the tuberosity. Close to 1 shaft diameter of displacement of distal fracture fragment toward the midline. There is mild valgus angulation of the distal fracture fragment. IMPRESSION: Acute, mildly comminuted, impacted displaced and slightly angulated fracture involving the proximal left humerus. No dislocation of the left humeral head. Electronically Signed   By: Jasmine Pang M.D.   On: 04/15/2017 00:04    Procedures Procedures (including critical care time)  Medications Ordered in ED Medications  acetaminophen (TYLENOL) tablet 650 mg (650 mg Oral Given 04/15/17 0106)     Initial Impression / Assessment and Plan / ED Course  I have reviewed the triage vital signs and the nursing notes.  Pertinent labs & imaging results that were available during my care of the patient were reviewed by me and considered in my medical decision making (see chart for details).     83 year old female who presents with left shoulder pain after mechanical fall that occurred this evening. He also reports that she hit the left side of her head. No LOC,  vomiting. Patient has been able to ambulate since the incident. Patient is afebrile, non-toxic appearing, sitting comfortably on examination table. Vital signs reviewed and stable. Patient is neurovascularly intact. Neuro exam is difficult to assess, secondary to patient's left upper extremity pain but otherwise no neuro deficits. Physical exam is concerning for fracture versus dislocation of the left shoulder. Will plan to obtain x-ray imaging of the left shoulder for further evaluation. Will also obtain CT head given significant hematoma and age. Patient offered analgesics but she declined at this time.  X-rays reviewed. Positive for mildly comminuted truck small left humerus fracture. No dislocation. CT head is negative for any acute abnormality. Discussed results with  patient. Patient will need a sling and immobilizer for support and stabilization. Instructed patient that she will have to follow up with orthopedic on an outpatient basis. Patient is seen by Dr. Wyline Mood who is done her previous orthopedic surgeries or like to follow up with him. Analgesics provided the department.  Reevaluation after splinting and immobilizer placement. Patient has good cap refill and is able to move the digit without any difficulty. Instructed patient to follow-up with referred orthopedic doctor or with Dr. Wyline Mood. Short course of pain medication given for severe breakthrough pain. Strict return precautions discussed. Patient expresses understanding and agreement to plan.    Final Clinical Impressions(s) / ED Diagnoses   Final diagnoses:  Closed fracture of proximal end of left humerus, unspecified fracture morphology, initial encounter  Hematoma    New Prescriptions New Prescriptions   HYDROCODONE-ACETAMINOPHEN (NORCO/VICODIN) 5-325 MG TABLET    Take 1-2 tablets by mouth every 6 (six) hours as needed.     Maxwell Caul, PA-C 04/15/17 1214    Paula Libra, MD 04/15/17 2233

## 2017-04-15 DIAGNOSIS — S42295A Other nondisplaced fracture of upper end of left humerus, initial encounter for closed fracture: Secondary | ICD-10-CM | POA: Diagnosis not present

## 2017-04-15 MED ORDER — ACETAMINOPHEN 325 MG PO TABS
650.0000 mg | ORAL_TABLET | Freq: Once | ORAL | Status: AC
  Administered 2017-04-15: 650 mg via ORAL
  Filled 2017-04-15: qty 2

## 2017-04-15 MED ORDER — HYDROCODONE-ACETAMINOPHEN 5-325 MG PO TABS: 1.0000 | ORAL_TABLET | Freq: Four times a day (QID) | ORAL | 0 refills | Status: AC | PRN

## 2017-04-15 NOTE — Discharge Instructions (Signed)
You can take tylenol as needed for pain. You can take the pain medication as needed for severe and breakthrough pain.   Wear the splint at all times until you're able to be seen by the orthopedic doctor.  Apply ice to the affected area to help with pain. Apply ice to the head to help with the swelling and pain.  Call your orthopedic doctor on Tuesday to arrange for an appointment. Call and tell him you were seen in the emergency department. He cannot make contact with him, I provided an additional orthopedic doctor that he can follow-up with.  Return to the emergency department for any worsening pain, redness or swelling to the arm, numbness/weakness of the arm, fever, chest pain, difficulty breathing.

## 2019-11-03 ENCOUNTER — Other Ambulatory Visit: Payer: Self-pay | Admitting: Neurosurgery

## 2019-11-03 DIAGNOSIS — M8448XK Pathological fracture, other site, subsequent encounter for fracture with nonunion: Secondary | ICD-10-CM

## 2019-11-04 ENCOUNTER — Encounter: Payer: Self-pay | Admitting: *Deleted

## 2019-11-04 ENCOUNTER — Ambulatory Visit
Admission: RE | Admit: 2019-11-04 | Discharge: 2019-11-04 | Disposition: A | Discharge status: Home / Self Care | Payer: Medicare Other | Source: Ambulatory Visit | Attending: Neurosurgery | Admitting: Neurosurgery

## 2019-11-04 ENCOUNTER — Other Ambulatory Visit: Payer: Self-pay

## 2019-11-04 DIAGNOSIS — M8448XK Pathological fracture, other site, subsequent encounter for fracture with nonunion: Secondary | ICD-10-CM

## 2019-11-04 HISTORY — PX: IR RADIOLOGIST EVAL & MGMT: IMG5224

## 2019-11-04 NOTE — Consult Note (Signed)
Chief Complaint: Sacral insufficiency fractures  Referring Physician(s): Payne,Mark  History of Present Illness: Heather Gaines is a 86 y.o. female with past medical history significant for MAI/bronchiectasis, pulmonary hypertension, hyperlipidemia and hypertension as well as recent diagnosis of ovarian cancer, post total hysterectomy and oophorectomy approximately 1 year ago with postoperative chemotherapy, who was referred today for consultation for potential percutaneous management of bilateral sacral insufficiency fractures demonstrated on elevated MRI performed 10/23/2019 for acute on chronic back/pelvic pain.  Patient has long history of back pain though approximately 4 weeks ago developed the insidious onset of acute sacral/pelvic pain.  She denies any recent injury or falls.  Patient has been treated with Tylenol #3 though states the pain has been persistent.  She states when she is not on her pain medicine her pain is 8-9 out of 10 at rest and worsens with any movement or activity.  Patient previously ambulated without assistive device however has since transitioned to a cane and ultimately to a walker.  Patient is otherwise without complaint.  Specifically no change in bowel or bladder function.  No fever or chills.    Past Medical History:  Diagnosis Date  . Bronchiectasis ([place])   . HOH (hard of hearing)   . Hyperlipidemia   . Hypertension   . MAI (mycobacterium avium-intracellulare) ([place])   . Pulmonary hypertension ([place])     Past Surgical History:  Procedure Laterality Date  . BREAST BIOPSY      Allergies: Sulfa antibiotics  Medications: Prior to Admission medications   Medication Sig Start Date End Date Taking? Authorizing Provider  albuterol (PROVENTIL HFA;VENTOLIN HFA) 108 (90 Base) MCG/ACT inhaler Inhale 2 puffs into the lungs every 4 (four) hours as needed for wheezing or shortness of breath.    [provider]  azithromycin (ZITHROMAX) 250 MG  tablet Take 250 mg by mouth daily.     [provider]  diltiazem (DILACOR XR) 180 MG 24 hr capsule Take 180 mg by mouth daily.    [provider]  HYDROcodone-acetaminophen (NORCO/VICODIN) 5-325 MG tablet Take 1-2 tablets by mouth every 6 (six) hours as needed. 04/15/17   Volanda Napoleon, PA-C  ondansetron (ZOFRAN) 4 MG tablet Take 1 tablet (4 mg total) by mouth every 6 (six) hours as needed for nausea. 05/11/16   Kelvin Cellar, MD  polyethylene glycol [facility] / Floria Raveling) packet Take 17 g by mouth daily as needed for mild constipation. 05/11/16   Kelvin Cellar, MD  potassium chloride SA (K-DUR,KLOR-CON) 20 MEQ tablet Take 1 tablet (20 mEq total) by mouth once. 05/11/16 05/11/16  Kelvin Cellar, MD  salmeterol (SEREVENT) 50 MCG/DOSE diskus inhaler Inhale 1 puff into the lungs 2 (two) times daily.    [provider]  simvastatin (ZOCOR) 10 MG tablet Take 10 mg by mouth daily.    [provider]     Family History  Problem Relation Age of Onset  . Diabetes Mother   . Hypertension Mother   . Hypertension Sister   . CAD Sister   . Breast cancer Sister   . Melanoma Sister   . Cancer Brother   . Stroke Neg Hx     Social History   Socioeconomic History  . Marital status: Widowed    Spouse name: Not on file  . Number of children: Not on file  . Years of education: Not on file  . Highest education level: Not on file  Occupational History  . Not on file  Tobacco Use  .  Smoking status: Never Smoker  . Smokeless tobacco: Never Used  Substance and Sexual Activity  . Alcohol use: Yes    Comment: occasional  . Drug use: No  . Sexual activity: Not Currently  Other Topics Concern  . Not on file  Social History Narrative  . Not on file   Social Determinants of Health   Financial Resource Strain:   . Difficulty of Paying Living Expenses:   Food Insecurity:   . Worried About Programme researcher, broadcasting/film/video in the Last Year:   . Barista in the Last  Year:   Transportation Needs:   . Freight forwarder (Medical):   Marland Kitchen Lack of Transportation (Non-Medical):   Physical Activity:   . Days of Exercise per Week:   . Minutes of Exercise per Session:   Stress:   . Feeling of Stress :   Social Connections:   . Frequency of Communication with Friends and Family:   . Frequency of Social Gatherings with Friends and Family:   . Attends Religious Services:   . Active Member of Clubs or Organizations:   . Attends Banker Meetings:   Marland Kitchen Marital Status:     ECOG Status: 2 - Symptomatic, <50% confined to bed  Review of Systems  Review of Systems: A 12 point ROS discussed and pertinent positives are indicated in the HPI above.  All other systems are negative.  Physical Exam No direct physical exam was performed (except for noted visual exam findings with Video Visits).   Vital Signs: There were no vitals taken for this visit.  Imaging:  Pelvic MRI - 10/23/2019 - note made of a bilateral sacral insufficiency fractures with associated fracture lines and rather significant increased T2 signal, right greater than left.  Labs:  CBC: No results for input(s): WBC, HGB, HCT, PLT in the last 8760 hours.  COAGS: No results for input(s): INR, APTT in the last 8760 hours.  BMP: No results for input(s): NA, K, CL, CO2, GLUCOSE, BUN, CALCIUM, CREATININE, GFRNONAA, GFRAA in the last 8760 hours.  Invalid input(s): CMP  LIVER FUNCTION TESTS: No results for input(s): BILITOT, AST, ALT, ALKPHOS, PROT, ALBUMIN in the last 8760 hours.  TUMOR MARKERS: No results for input(s): AFPTM, CEA, CA199, CHROMGRNA in the last 8760 hours.  Assessment and Plan:  Heather Gaines is a 86 y.o. female with past medical history significant for MAI/bronchiectasis, pulmonary hypertension, hyperlipidemia and hypertension as well as recent diagnosis of ovarian cancer, post total hysterectomy and oophorectomy approximately 1 year ago with postoperative  chemotherapy, who was referred today for consultation for potential percutaneous management of bilateral sacral insufficiency fractures demonstrated on elevated MRI performed 10/23/2019 for acute on chronic back/pelvic pain.  Patient has long history of back pain though approximately 4 weeks ago developed the insidious onset of acute sacral/pelvic pain.  She denies any recent injury or falls.  Personal review of pelvic MRI performed 10/23/2019 [facility] examination) demonstrates bilateral sacral insufficiency fractures with associated T2 abnormality, greater than left, new compared to CT of the chest, abdomen and pelvis performed 10/01/2019.  Patient's pain has persisted with associated decreased in her functional mobility, previously ambulated without assistive device however has since transitioned to a cane and ultimately to a walker.  As such, the risks and benefits of bilateral sacral cement augmentation was discussed with the patient including, but not limited to education regarding the natural healing process of compression fractures without intervention, bleeding, infection, cement migration which may cause spinal cord  damage, paralysis, pulmonary embolism or even death.  Following this prolonged and detailed conversation, the patient wished to proceed with attempted bilateral sacral cement augmentation.  As such, we will begin the process to obtain insurance authorization.    Plan: -[employer] approval for bilateral sacral insufficiency fracture cement augmentation. -Once insurance approval is obtained, the procedure will be scheduled at [facility] hospital and performed on outpatient basis.  While the patient prefers that I perform the procedure, she is willing to have one of my capable interventional radiology partners (Drs. Jacqualyn Posey or [place]) perform the procedure if this will entail the procedure being performed in a more timely fashion especially as I am off next  week.   Thank you for this interesting consult.  I greatly enjoyed meeting Heather Gaines and look forward to participating in their care.  A copy of this report was sent to the requesting provider on this date.  Electronically Signed: Simonne Come 11/04/2019, 11:36 AM   I spent a total of 30 Minutes in remote  clinical consultation, greater than 50% of which was counseling/coordinating care for bilateral sacral insufficiency fractures.    Visit type: Audio only (telephone). Audio (no video) only due to patient's lack of internet/smartphone capability. Alternative for in-person consultation at [facility], [address] [place], [place], [place]. This visit type was conducted due to national recommendations for restrictions regarding the COVID-19 Pandemic (e.g. social distancing).  This format is felt to be most appropriate for this patient at this time.  All issues noted in this document were discussed and addressed.

## 2019-11-12 ENCOUNTER — Other Ambulatory Visit: Payer: Self-pay | Admitting: Interventional Radiology

## 2019-11-12 DIAGNOSIS — M8448XK Pathological fracture, other site, subsequent encounter for fracture with nonunion: Secondary | ICD-10-CM

## 2019-12-04 ENCOUNTER — Other Ambulatory Visit: Payer: Self-pay

## 2019-12-04 ENCOUNTER — Ambulatory Visit
Admission: RE | Admit: 2019-12-04 | Discharge: 2019-12-04 | Disposition: A | Discharge status: Home / Self Care | Payer: Medicare Other | Source: Ambulatory Visit | Attending: Interventional Radiology | Admitting: Interventional Radiology

## 2019-12-04 ENCOUNTER — Encounter: Payer: Self-pay | Admitting: *Deleted

## 2019-12-04 DIAGNOSIS — M8448XK Pathological fracture, other site, subsequent encounter for fracture with nonunion: Secondary | ICD-10-CM

## 2019-12-04 HISTORY — PX: IR RADIOLOGIST EVAL & MGMT: IMG5224

## 2019-12-04 NOTE — Progress Notes (Signed)
Patient ID: Heather Gaines, female   DOB: 1924/11/19, 86 y.o.   MRN: [id number]        Chief Complaint: Sacral insufficiency fractures  Referring Physician(s): Dani Gobble  History of Present Illness:  Heather Gaines is a 86 y.o. female with past medical history significant for MAI/bronchiectasis, pulmonary hypertension, hyperlipidemia and hypertension as well as recent diagnosis of ovarian cancer, post total hysterectomy and oophorectomy approximately 1 year ago with postoperative chemotherapy, who  is seen today in follow-up consultation following technically successful image guided sacral augmentation performed 11/07/2019 for symptomatic bilateral sacral insufficiency fractures.  In brief review, prior to initial consultation performed 11/04/2019, patient reported an approximately 4 week history of the insidious development of sacral/pelvic pain ultimately found to be secondary to bilateral sacral insufficiency fractures demonstrated on pelvic MRI performed 10/23/2019.  Patient has been treated with Tylenol #3 though states the pain has been persistent.  She states when she is not on her pain medicine she rated her pain at 8-9 out of 10 at rest and worsens with any movement or activity.  Patient underwent technically successful image guided cement sacral augmentation 11/07/2019.  Patient reports several day history of soreness following the procedure though ultimately experienced significant reduction in her pain, currently 2 out of 10 though states today she has a little bit of worsening low back pain which she attributes to muscle spasm.  She is no longer taking pain medication.  Patient is otherwise without complaint and is overall pleased with the procedure  Patient states she is to undergo additional chemotherapy next week.   Past Medical History:  Diagnosis Date  . Bronchiectasis (HCC)   . HOH (hard of hearing)   . Hyperlipidemia   . Hypertension   . MAI (mycobacterium  avium-intracellulare) (HCC)   . Pulmonary hypertension (HCC)     Past Surgical History:  Procedure Laterality Date  . BREAST BIOPSY    . IR RADIOLOGIST EVAL & MGMT  11/04/2019    Allergies: Sulfa antibiotics  Medications: Prior to Admission medications   Medication Sig Start Date End Date Taking? Authorizing Provider  albuterol (PROVENTIL HFA;VENTOLIN HFA) 108 (90 Base) MCG/ACT inhaler Inhale 2 puffs into the lungs every 4 (four) hours as needed for wheezing or shortness of breath.    [provider]  azithromycin (ZITHROMAX) 250 MG tablet Take 250 mg by mouth daily.     [provider]  diltiazem (DILACOR XR) 180 MG 24 hr capsule Take 180 mg by mouth daily.    [provider]  HYDROcodone-acetaminophen (NORCO/VICODIN) 5-325 MG tablet Take 1-2 tablets by mouth every 6 (six) hours as needed. 04/15/17   Maxwell Caul, PA-C  ondansetron (ZOFRAN) 4 MG tablet Take 1 tablet (4 mg total) by mouth every 6 (six) hours as needed for nausea. 05/11/16   Jeralyn Bennett, MD  polyethylene glycol [facility] / Ethelene Hal) packet Take 17 g by mouth daily as needed for mild constipation. 05/11/16   Jeralyn Bennett, MD  potassium chloride SA (K-DUR,KLOR-CON) 20 MEQ tablet Take 1 tablet (20 mEq total) by mouth once. 05/11/16 05/11/16  Jeralyn Bennett, MD  salmeterol (SEREVENT) 50 MCG/DOSE diskus inhaler Inhale 1 puff into the lungs 2 (two) times daily.    [provider]  simvastatin (ZOCOR) 10 MG tablet Take 10 mg by mouth daily.    [provider]     Family History  Problem Relation Age of Onset  . Diabetes Mother   . Hypertension Mother   . Hypertension Sister   .  CAD Sister   . Breast cancer Sister   . Melanoma Sister   . Cancer Brother   . Stroke Neg Hx     Social History   Socioeconomic History  . Marital status: Widowed    Spouse name: Not on file  . Number of children: Not on file  . Years of education: Not on file  . Highest education  level: Not on file  Occupational History  . Not on file  Tobacco Use  . Smoking status: Never Smoker  . Smokeless tobacco: Never Used  Substance and Sexual Activity  . Alcohol use: Yes    Comment: occasional  . Drug use: No  . Sexual activity: Not Currently  Other Topics Concern  . Not on file  Social History Narrative  . Not on file   Social Determinants of Health   Financial Resource Strain:   . Difficulty of Paying Living Expenses:   Food Insecurity:   . Worried About Programme researcher, broadcasting/film/video in the Last Year:   . Barista in the Last Year:   Transportation Needs:   . Freight forwarder (Medical):   Marland Kitchen Lack of Transportation (Non-Medical):   Physical Activity:   . Days of Exercise per Week:   . Minutes of Exercise per Session:   Stress:   . Feeling of Stress :   Social Connections:   . Frequency of Communication with Friends and Family:   . Frequency of Social Gatherings with Friends and Family:   . Attends Religious Services:   . Active Member of Clubs or Organizations:   . Attends Banker Meetings:   Marland Kitchen Marital Status:     ECOG Status: 1 - Symptomatic but completely ambulatory  Review of Systems  Review of Systems: A 12 point ROS discussed and pertinent positives are indicated in the HPI above.  All other systems are negative.  Physical Exam No direct physical exam was performed (except for noted visual exam findings with Video Visits).   Vital Signs: There were no vitals taken for this visit.  Imaging:  Selected images from bilateral image guided kyphoplasty performed 04/22/2019 were reviewed.  Labs:  CBC: No results for input(s): WBC, HGB, HCT, PLT in the last 8760 hours.  COAGS: No results for input(s): INR, APTT in the last 8760 hours.  BMP: No results for input(s): NA, K, CL, CO2, GLUCOSE, BUN, CALCIUM, CREATININE, GFRNONAA, GFRAA in the last 8760 hours.  Invalid input(s): CMP  LIVER FUNCTION TESTS: No results for  input(s): BILITOT, AST, ALT, ALKPHOS, PROT, ALBUMIN in the last 8760 hours.  TUMOR MARKERS: No results for input(s): AFPTM, CEA, CA199, CHROMGRNA in the last 8760 hours.  Assessment and Plan:  Leaira Alcaraz is a 86 y.o. female with past medical history significant for MAI/bronchiectasis, pulmonary hypertension, hyperlipidemia and hypertension as well as recent diagnosis of ovarian cancer, post total hysterectomy and oophorectomy approximately 1 year ago with postoperative chemotherapy, who  is seen today in follow-up consultation following technically successful image guided sacral augmentation performed 11/07/2019 for symptomatic bilateral sacral insufficiency fractures.  Selected images from image guided sacral cement augmentation performed 3/26 at 2021 were reviewed  Patient reports several day history of soreness following the procedure though ultimately experienced significant reduction in her pain, currently 2 out of 10 though states today she has a little bit of worsening low back pain which she attributes to muscle spasm.  She is no longer taking pain medication.  Patient is overall pleased  with the procedure.  Given subjective improvement, additional imaging is not warranted at this time.  As patient has experienced sacral insufficiency fracture, I educated the patient may be at risk for additional compression fractures in the future, especially as she is undergoing chemotherapy.    I explained that if she were to experience sudden onset back pain, similar to her sacral insufficiency fracture pain, this could herald a new compression fracture and she should not hesitate to seek consultation.  The patient demonstrated excellent understanding of this conversation.  The patient knows to call the interventional radiology clinic with any future questions or concerns but may otherwise follow-up on a as needed basis.  A copy of this report was sent to the requesting provider on this  date.  Electronically Signed: Sandi Mariscal 12/04/2019, 3:27 PM   I spent a total of 10 Minutes in remote  clinical consultation, greater than 50% of which was counseling/coordinating care for post bilateral sacral cement augmentation.    Visit type: Audio only (telephone). Audio (no video) only due to patient's lack of internet/smartphone capability. Alternative for in-person consultation at [facility], [address], [place], [place]. This visit type was conducted due to national recommendations for restrictions regarding the COVID-19 Pandemic (e.g. social distancing).  This format is felt to be most appropriate for this patient at this time.  All issues noted in this document were discussed and addressed.

## 2019-12-31 ENCOUNTER — Other Ambulatory Visit: Payer: Self-pay

## 2019-12-31 ENCOUNTER — Emergency Department (HOSPITAL_BASED_OUTPATIENT_CLINIC_OR_DEPARTMENT_OTHER): Payer: Medicare Other

## 2019-12-31 ENCOUNTER — Encounter (HOSPITAL_BASED_OUTPATIENT_CLINIC_OR_DEPARTMENT_OTHER): Payer: Self-pay | Admitting: *Deleted

## 2019-12-31 DIAGNOSIS — I10 Essential (primary) hypertension: Secondary | ICD-10-CM | POA: Diagnosis not present

## 2019-12-31 DIAGNOSIS — Z79899 Other long term (current) drug therapy: Secondary | ICD-10-CM | POA: Insufficient documentation

## 2019-12-31 DIAGNOSIS — M25552 Pain in left hip: Secondary | ICD-10-CM | POA: Diagnosis present

## 2019-12-31 NOTE — ED Triage Notes (Signed)
pt c/o left hip pain w/o injury x 1 day

## 2020-01-01 ENCOUNTER — Emergency Department (HOSPITAL_BASED_OUTPATIENT_CLINIC_OR_DEPARTMENT_OTHER)
Admission: EM | Admit: 2020-01-01 | Discharge: 2020-01-01 | Disposition: A | Discharge status: Home / Self Care | Payer: Medicare Other | Attending: Emergency Medicine | Admitting: Emergency Medicine

## 2020-01-01 DIAGNOSIS — M25552 Pain in left hip: Secondary | ICD-10-CM

## 2020-01-01 NOTE — ED Provider Notes (Signed)
[name]: Heather Spurling, MD, FACEP  CSN: [id number] MRN: [id number] ARRIVAL: 12/31/19 at 2232 ROOM: [place]  Hip Pain   HISTORY OF PRESENT ILLNESS  01/01/20 3:34 AM Heather Gaines is a 86 y.o. female who had sacral injections for fractures several weeks ago which went successfully.  She is here with left hip pain which developed yesterday afternoon.  She felt a click on her left hip and subsequently had pain.  There was no fall or specific trauma.  She took Tylenol and ibuprofen before coming to the ED.  On arrival her pain was a 5 out of 10, worse with weightbearing.  Now she denies any pain and is able to walk with assistance (usually uses a walker).   Past Medical History:  Diagnosis Date  . Bronchiectasis ([place])   . HOH (hard of hearing)   . Hyperlipidemia   . Hypertension   . MAI (mycobacterium avium-intracellulare) ([place])   . Pulmonary hypertension ([place])     Past Surgical History:  Procedure Laterality Date  . BREAST BIOPSY    . IR RADIOLOGIST EVAL & MGMT  11/04/2019  . IR RADIOLOGIST EVAL & MGMT  12/04/2019    Family History  Problem Relation Age of Onset  . Diabetes Mother   . Hypertension Mother   . Hypertension Sister   . CAD Sister   . Breast cancer Sister   . Melanoma Sister   . Cancer Brother   . Stroke Neg Hx     Social History   Tobacco Use  . Smoking status: Never Smoker  . Smokeless tobacco: Never Used  Substance Use Topics  . Alcohol use: Yes    Comment: occasional  . Drug use: No    Prior to Admission medications   Medication Sig Start Date End Date Taking? Authorizing Provider  albuterol (PROVENTIL HFA;VENTOLIN HFA) 108 (90 Base) MCG/ACT inhaler Inhale 2 puffs into the lungs every 4 (four) hours as needed for wheezing or shortness of breath.    [provider]  azithromycin (ZITHROMAX) 250 MG tablet Take 250 mg by mouth daily.     [provider]  diltiazem (DILACOR XR) 180  MG 24 hr capsule Take 180 mg by mouth daily.    [provider]  HYDROcodone-acetaminophen (NORCO/VICODIN) 5-325 MG tablet Take 1-2 tablets by mouth every 6 (six) hours as needed. 04/15/17   Volanda Napoleon, PA-C  ondansetron (ZOFRAN) 4 MG tablet Take 1 tablet (4 mg total) by mouth every 6 (six) hours as needed for nausea. 05/11/16   Kelvin Cellar, MD  polyethylene glycol [facility] / Floria Raveling) packet Take 17 g by mouth daily as needed for mild constipation. 05/11/16   Kelvin Cellar, MD  potassium chloride SA (K-DUR,KLOR-CON) 20 MEQ tablet Take 1 tablet (20 mEq total) by mouth once. 05/11/16 05/11/16  Kelvin Cellar, MD  salmeterol (SEREVENT) 50 MCG/DOSE diskus inhaler Inhale 1 puff into the lungs 2 (two) times daily.    [provider]  simvastatin (ZOCOR) 10 MG tablet Take 10 mg by mouth daily.    [provider]    Allergies Sulfa antibiotics   REVIEW OF SYSTEMS  Negative except as noted here or in the History of Present Illness.   PHYSICAL EXAMINATION  Initial Vital Signs Blood pressure (!) 183/71, pulse 81, temperature 98.4 F (36.9 C), resp. rate 18, height 5\' 3"  (1.6 m), weight 54.4 kg, SpO2 98 %.  Examination General: Well-developed, well-nourished female in no acute  distress; appearance consistent with age of record HENT: normocephalic; atraumatic Eyes: pupils equal, round and reactive to light; extraocular muscles intact; bilateral pseudophakia Neck: supple Heart: regular rate and rhythm Lungs: Inspiratory and expiratory rhonchi on the right Abdomen: soft; nondistended; nontender; bowel sounds present Extremities: No acute deformity; full range of motion; edema of lower legs; no pain on passive range of motion of hips Neurologic: Awake, alert and oriented; motor function intact in all extremities and symmetric; no facial droop; hard of hearing Skin: Warm and dry Psychiatric: Normal mood and affect   RESULTS  Summary of this visit's results,  reviewed and interpreted by myself:   EKG Interpretation  Date/Time:    Ventricular Rate:    PR Interval:    QRS Duration:   QT Interval:    QTC Calculation:   R Axis:     Text Interpretation:        Laboratory Studies: No results found for this or any previous visit (from the past 24 hour(s)). Imaging Studies: DG Hip Unilat W or Wo Pelvis 2-3 Views Left  Result Date: 12/31/2019 CLINICAL DATA:  Left hip pain, sacral insufficiency fracture status post cement augmentation EXAM: DG HIP (WITH OR WITHOUT PELVIS) 2-3V LEFT COMPARISON:  11/07/2019 FINDINGS: Frontal view of the pelvis as well as frontal and frogleg lateral views of the left hip are obtained. Postprocedural changes are seen from cement augmentation within the bilateral sacral ala. There are no acute displaced pelvic or hip fractures. Hip joint spaces are relatively well preserved. Soft tissues are normal. Severe spondylosis again seen in the lower lumbar spine. IMPRESSION: 1. Postprocedural changes from cement augmentation of the sacrum. 2. No acute displaced fracture. Electronically Signed   By: Sharlet Salina M.D.   On: 12/31/2019 23:19    ED COURSE and MDM  Nursing notes, initial and subsequent vitals signs, including pulse oximetry, reviewed and interpreted by myself.  Vitals:   12/31/19 2241 12/31/19 2243  BP:  (!) 183/71  Pulse:  81  Resp:  18  Temp:  98.4 F (36.9 C)  SpO2:  98%  Weight: 54.4 kg   Height: 5\' 3"  (1.6 m)    Medications - No data to display  Patient is without pain and able to ambulate with assistance which she states is her baseline.  I do not believe additional imaging studies are indicated at this time.  PROCEDURES  Procedures   ED DIAGNOSES     ICD-10-CM   1. Left hip pain  M25.552        Heather Gaines, , MD 01/01/20 [phone]

## 2020-10-17 IMAGING — DX DG HIP (WITH OR WITHOUT PELVIS) 2-3V*L*
4 series · 4 of 4 positions shown · non-contrast
Comparison: 11/07/2019

CLINICAL DATA: Left hip pain, sacral insufficiency fracture status
post cement augmentation

EXAM:
DG HIP (WITH OR WITHOUT PELVIS) 2-3V LEFT

[pelvis ap (1 of 2)]
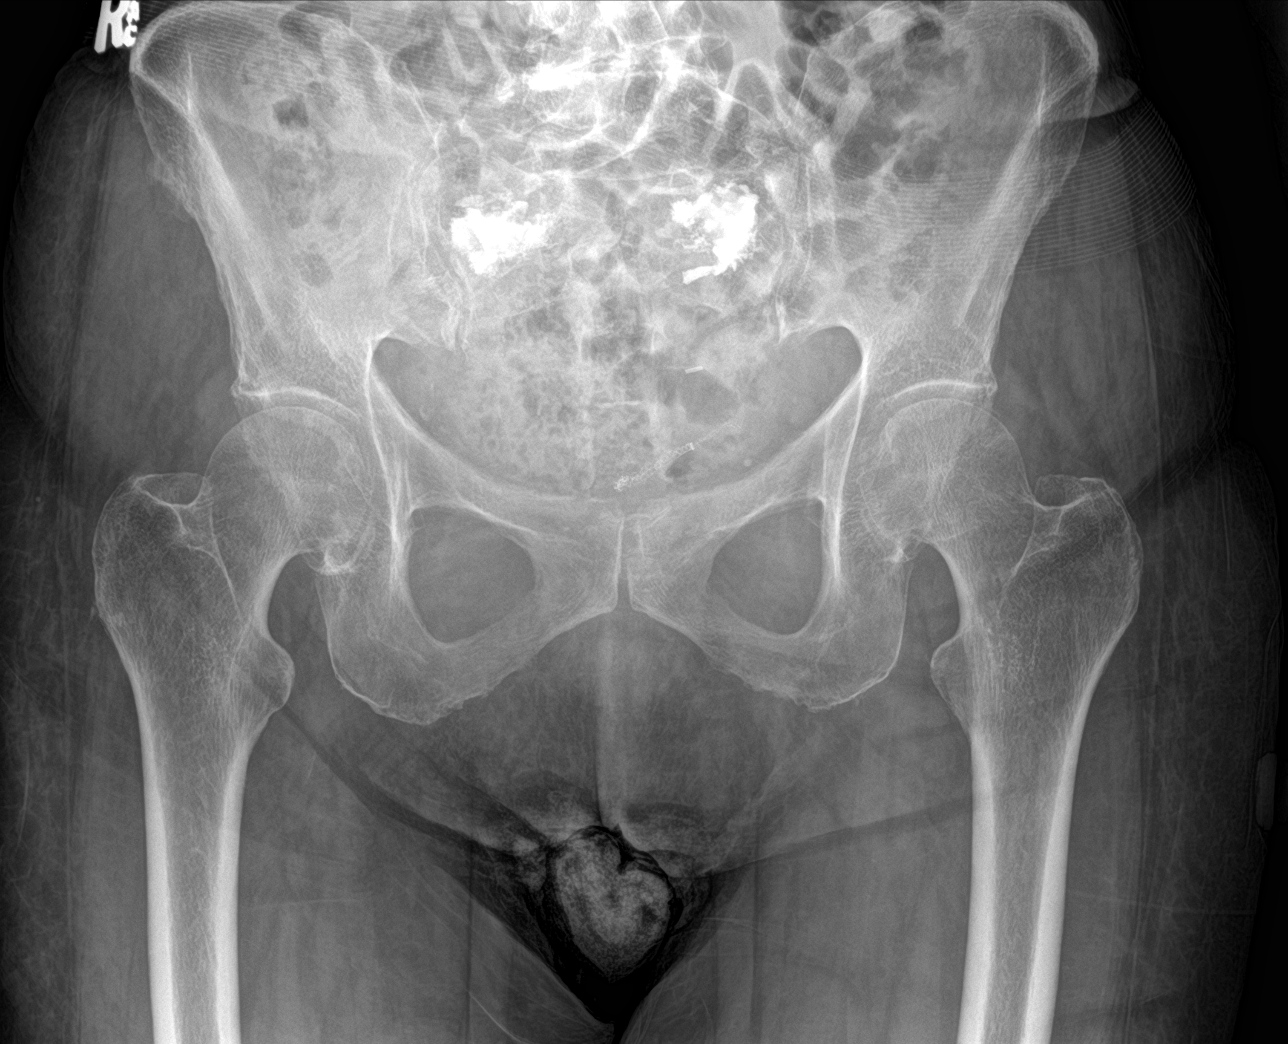

[hip ap]
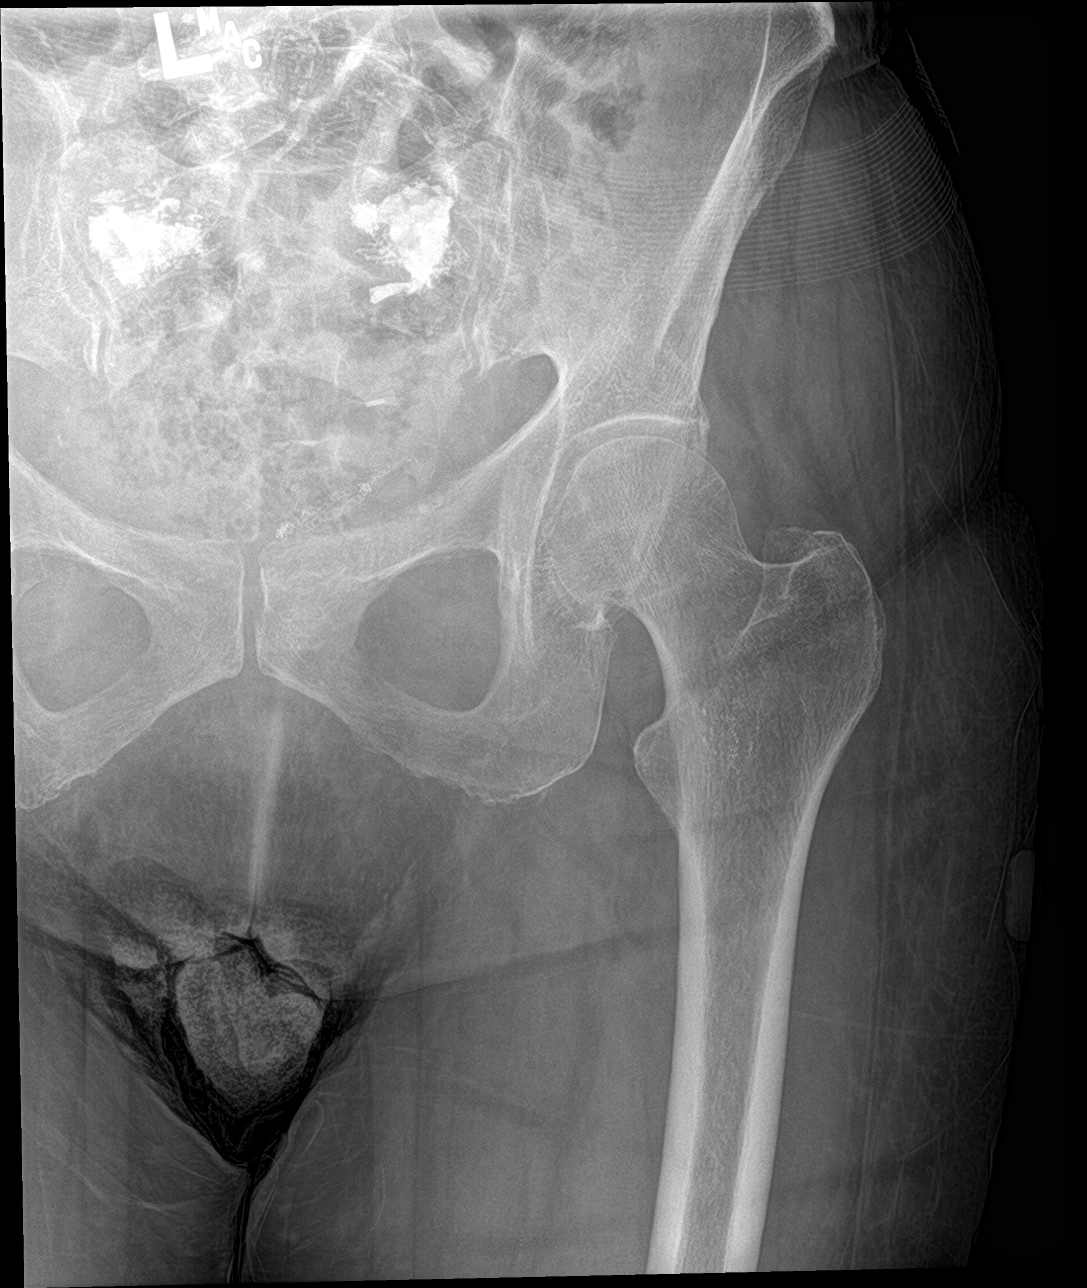

[hip lat]
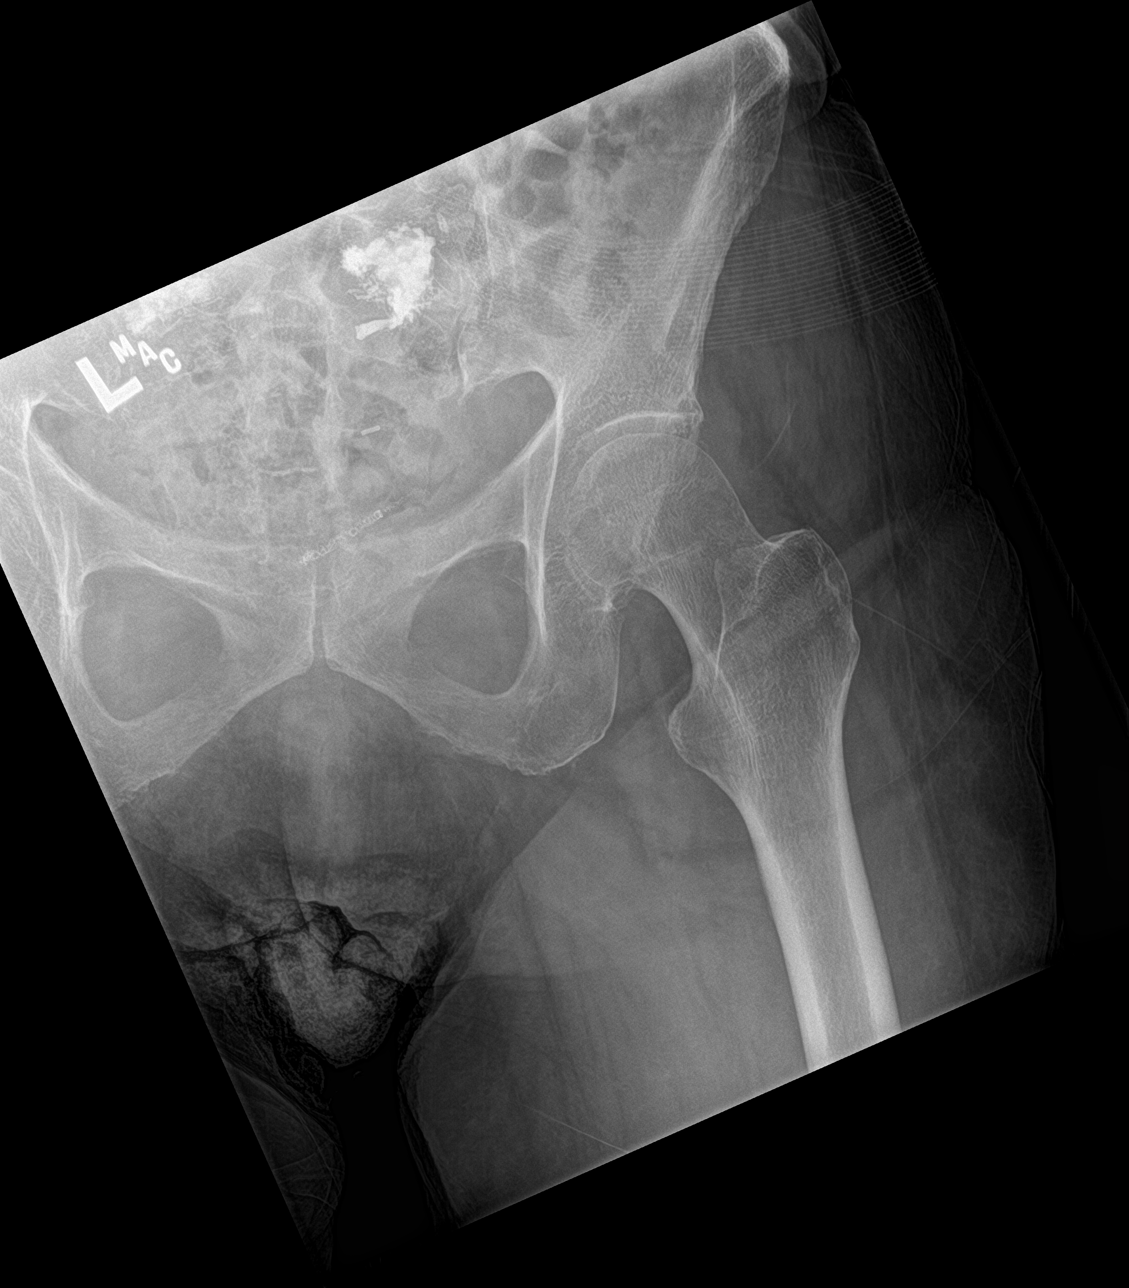

[pelvis ap (2 of 2)]
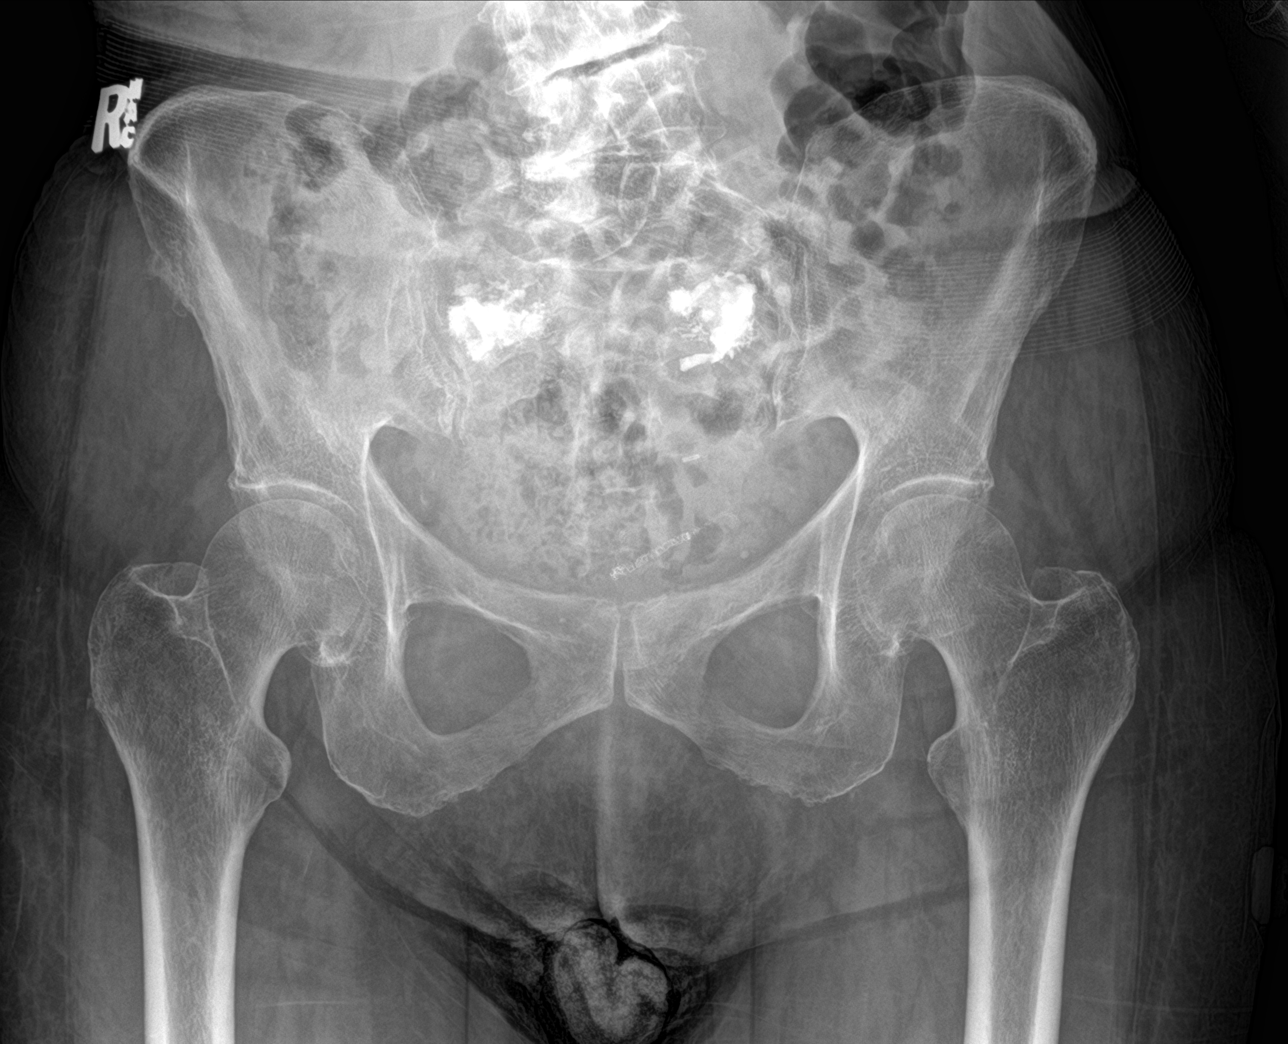

[4 of 4 positions shown; findings below may reference images not displayed]

FINDINGS: Frontal view of the pelvis as well as frontal and frogleg lateral
views of the left hip are obtained. Postprocedural changes are seen
from cement augmentation within the bilateral sacral ala. There are
no acute displaced pelvic or hip fractures. Hip joint spaces are
relatively well preserved. Soft tissues are normal. Severe
spondylosis again seen in the lower lumbar spine.
IMPRESSION: 1. Postprocedural changes from cement augmentation of the sacrum.
2. No acute displaced fracture.

## 2021-06-14 DEATH — deceased
